# Patient Record
Sex: Male | Born: 1940 | Race: White | Hispanic: No | Marital: Married | Smoking: Former smoker
Health system: Southern US, Community
[De-identification: ages and names within clinical notes are randomized; demographics above are authoritative.]

## PROBLEM LIST (undated history)

## (undated) DIAGNOSIS — M5416 Radiculopathy, lumbar region: Secondary | ICD-10-CM

## (undated) DIAGNOSIS — N411 Chronic prostatitis: Secondary | ICD-10-CM

## (undated) DIAGNOSIS — H905 Unspecified sensorineural hearing loss: Secondary | ICD-10-CM

## (undated) DIAGNOSIS — R9439 Abnormal result of other cardiovascular function study: Secondary | ICD-10-CM

## (undated) DIAGNOSIS — N183 Chronic kidney disease, stage 3 unspecified: Secondary | ICD-10-CM

## (undated) DIAGNOSIS — N400 Enlarged prostate without lower urinary tract symptoms: Secondary | ICD-10-CM

## (undated) DIAGNOSIS — G4733 Obstructive sleep apnea (adult) (pediatric): Secondary | ICD-10-CM

## (undated) DIAGNOSIS — R0602 Shortness of breath: Secondary | ICD-10-CM

## (undated) DIAGNOSIS — R300 Dysuria: Secondary | ICD-10-CM

## (undated) DIAGNOSIS — E663 Overweight: Secondary | ICD-10-CM

## (undated) DIAGNOSIS — I83892 Varicose veins of left lower extremities with other complications: Secondary | ICD-10-CM

## (undated) DIAGNOSIS — J329 Chronic sinusitis, unspecified: Secondary | ICD-10-CM

## (undated) DIAGNOSIS — K219 Gastro-esophageal reflux disease without esophagitis: Secondary | ICD-10-CM

## (undated) DIAGNOSIS — K2100 Gastro-esophageal reflux disease with esophagitis, without bleeding: Secondary | ICD-10-CM

## (undated) DIAGNOSIS — G4730 Sleep apnea, unspecified: Secondary | ICD-10-CM

## (undated) DIAGNOSIS — J449 Chronic obstructive pulmonary disease, unspecified: Secondary | ICD-10-CM

## (undated) DIAGNOSIS — E785 Hyperlipidemia, unspecified: Secondary | ICD-10-CM

## (undated) DIAGNOSIS — J343 Hypertrophy of nasal turbinates: Secondary | ICD-10-CM

## (undated) DIAGNOSIS — G629 Polyneuropathy, unspecified: Secondary | ICD-10-CM

## (undated) DIAGNOSIS — J302 Other seasonal allergic rhinitis: Secondary | ICD-10-CM

## (undated) DIAGNOSIS — D509 Iron deficiency anemia, unspecified: Secondary | ICD-10-CM

## (undated) DIAGNOSIS — M5126 Other intervertebral disc displacement, lumbar region: Secondary | ICD-10-CM

## (undated) DIAGNOSIS — K649 Unspecified hemorrhoids: Secondary | ICD-10-CM

## (undated) DIAGNOSIS — R972 Elevated prostate specific antigen [PSA]: Secondary | ICD-10-CM

## (undated) DIAGNOSIS — R079 Chest pain, unspecified: Secondary | ICD-10-CM

## (undated) DIAGNOSIS — M545 Low back pain, unspecified: Secondary | ICD-10-CM

## (undated) DIAGNOSIS — J22 Unspecified acute lower respiratory infection: Secondary | ICD-10-CM

## (undated) DIAGNOSIS — K648 Other hemorrhoids: Secondary | ICD-10-CM

## (undated) HISTORY — DX: Hypertrophy of nasal turbinates: J34.3

## (undated) HISTORY — DX: Unspecified sensorineural hearing loss: H90.5

## (undated) HISTORY — DX: Radiculopathy, lumbar region: M54.16

## (undated) HISTORY — PX: SPINE SURGERY: SHX786

## (undated) HISTORY — DX: Elevated prostate specific antigen (PSA): R97.20

## (undated) HISTORY — DX: Polyneuropathy, unspecified: G62.9

## (undated) HISTORY — DX: Obstructive sleep apnea (adult) (pediatric): G47.33

## (undated) HISTORY — DX: Gastro-esophageal reflux disease with esophagitis: K21.0

## (undated) HISTORY — DX: Gastro-esophageal reflux disease without esophagitis: K21.9

## (undated) HISTORY — DX: Overweight: E66.3

## (undated) HISTORY — DX: Low back pain, unspecified: M54.50

## (undated) HISTORY — DX: Benign prostatic hyperplasia without lower urinary tract symptoms: N40.0

## (undated) HISTORY — DX: Other seasonal allergic rhinitis: J30.2

## (undated) HISTORY — DX: Chronic kidney disease, stage 3 unspecified: N18.30

## (undated) HISTORY — DX: Gastro-esophageal reflux disease with esophagitis, without bleeding: K21.00

## (undated) HISTORY — DX: Shortness of breath: R06.02

## (undated) HISTORY — DX: Other intervertebral disc displacement, lumbar region: M51.26

## (undated) HISTORY — DX: Other hemorrhoids: K64.8

## (undated) HISTORY — DX: Iron deficiency anemia, unspecified: D50.9

## (undated) HISTORY — DX: Unspecified acute lower respiratory infection: J22

## (undated) HISTORY — PX: NASAL SINUS SURGERY: SHX719

## (undated) HISTORY — DX: Dysuria: R30.0

## (undated) HISTORY — DX: Hyperlipidemia, unspecified: E78.5

## (undated) HISTORY — DX: Chronic sinusitis, unspecified: J32.9

## (undated) HISTORY — DX: Unspecified hemorrhoids: K64.9

## (undated) HISTORY — DX: Chronic obstructive pulmonary disease, unspecified: J44.9

## (undated) HISTORY — DX: Chronic prostatitis: N41.1

## (undated) HISTORY — DX: Low back pain: M54.5

## (undated) HISTORY — DX: Chest pain, unspecified: R07.9

## (undated) HISTORY — DX: Abnormal result of other cardiovascular function study: R94.39

## (undated) HISTORY — DX: Varicose veins of left lower extremity with other complications: I83.892

## (undated) HISTORY — DX: Sleep apnea, unspecified: G47.30

---
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
## 1999-06-20 ENCOUNTER — Ambulatory Visit (HOSPITAL_COMMUNITY): Admission: RE | Admit: 1999-06-20 | Discharge: 1999-06-20 | Payer: Self-pay | Admitting: Specialist

## 2000-12-12 ENCOUNTER — Ambulatory Visit (HOSPITAL_COMMUNITY): Admission: RE | Admit: 2000-12-12 | Discharge: 2000-12-12 | Payer: Self-pay | Admitting: Orthopaedic Surgery

## 2000-12-12 ENCOUNTER — Encounter: Payer: Self-pay | Admitting: Orthopaedic Surgery

## 2003-04-21 ENCOUNTER — Ambulatory Visit (HOSPITAL_BASED_OUTPATIENT_CLINIC_OR_DEPARTMENT_OTHER): Admission: RE | Admit: 2003-04-21 | Discharge: 2003-04-21 | Payer: Self-pay | Admitting: Internal Medicine

## 2003-04-21 ENCOUNTER — Encounter: Payer: Self-pay | Admitting: Pulmonary Disease

## 2003-08-17 ENCOUNTER — Encounter: Payer: Self-pay | Admitting: Pulmonary Disease

## 2003-09-22 ENCOUNTER — Encounter: Payer: Self-pay | Admitting: Pulmonary Disease

## 2003-11-08 ENCOUNTER — Encounter: Payer: Self-pay | Admitting: Pulmonary Disease

## 2006-09-04 ENCOUNTER — Inpatient Hospital Stay (HOSPITAL_COMMUNITY): Admission: RE | Admit: 2006-09-04 | Discharge: 2006-09-05 | Payer: Self-pay | Admitting: Orthopaedic Surgery

## 2007-09-18 ENCOUNTER — Ambulatory Visit: Payer: Self-pay | Admitting: Pulmonary Disease

## 2007-09-18 DIAGNOSIS — G4733 Obstructive sleep apnea (adult) (pediatric): Secondary | ICD-10-CM | POA: Insufficient documentation

## 2007-09-18 DIAGNOSIS — G4730 Sleep apnea, unspecified: Secondary | ICD-10-CM

## 2007-09-18 DIAGNOSIS — K219 Gastro-esophageal reflux disease without esophagitis: Secondary | ICD-10-CM | POA: Insufficient documentation

## 2007-09-18 HISTORY — DX: Sleep apnea, unspecified: G47.30

## 2007-09-18 HISTORY — DX: Obstructive sleep apnea (adult) (pediatric): G47.33

## 2007-10-20 ENCOUNTER — Telehealth (INDEPENDENT_AMBULATORY_CARE_PROVIDER_SITE_OTHER): Payer: Self-pay | Admitting: *Deleted

## 2007-12-04 ENCOUNTER — Encounter: Payer: Self-pay | Admitting: Pulmonary Disease

## 2007-12-18 ENCOUNTER — Telehealth: Payer: Self-pay | Admitting: Pulmonary Disease

## 2008-06-02 ENCOUNTER — Ambulatory Visit: Payer: Self-pay | Admitting: Pulmonary Disease

## 2010-06-13 NOTE — Op Note (Signed)
NAMELESEAN, [name] [name]                ACCOUNT NO.:  [id number]   MEDICAL RECORD NO.:  [id number]          PATIENT TYPE:  INP   LOCATION:  2550                         FACILITY:  [facility]   PHYSICIAN:  Sharolyn Douglas, M.D.        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   PROCEDURE PERFORMED:  Minimally invasive micro laminotomy, left L4-L5,  with diskectomy, foraminotomy and lateral recess decompression.   SURGEON:  Sharolyn Douglas, M.D.   ASSISTANT SURGEON:  Fannie Knee Art, N.P.A.   ANESTHESIA:  General endotracheal.   ESTIMATED BLOOD LOSS:  Minimal.   COMPLICATIONS:  None.   SURGICAL COUNTS:  Needle and sponge counts were correct.   INDICATIONS FOR PROCEDURE:  The patient is a pleasant 70 year old male  with severe left lower extremity radicular pain and foot drop.  His  imaging studies show an extruded disk herniation at L4-L5.  He now  presents for minimally invasive decompression.  The risks, benefits and  alternatives were reviewed and the patient elected to proceed.   DESCRIPTION OF PROCEDURE:  After informed consent, the patient was taken  to the operating room.  He underwent general endotracheal anesthesia  without difficulty, and was given prophylactic IV antibiotics.  He was  carefully turned prone onto the Wilson frame.  All bony prominences were  padded, basically protected at all times, the back prepped and draped in  the usual sterile fashion.  Fluoroscopy was brought into the field.   The L4-L5 interspace was identified.  A 2 cm incision was made over the  L4-L5 interspace in the midline.  Dissection was carried sharply through  the deep fascia.  Dilators from the Abbott spine minimally invasive  retractor were used to dock onto the L4-L5 interspace.  This was done  under fluoroscopic  guidance.  We then placed the 70 mm retractor blades  over the last dilator and the retractor was expanded, dilating the  paraspinal muscles.  Further fluoroscopic imaging confirmed that the  retractor was placed over the L4-L5 segment.   At this point the microscope was draped and brought into the field.  The  L4-L5 interlaminar space was further exposed.  A high-speed bur was used  to remove the inferior one-third of the L4 lamina.  The ligamentum  flavum was removed piecemeal.  The lateral border of the thecal sac was  identified and mobilized.  The lateral recess was decompressed by  undercutting the facet joint and removing the ligamentum flavum.  We  identified the transversing L5 nerve root.  There was a bulging disk in  the lateral recess.  The disk space was entered and Epstein curets were  used to decompress the chronic-appearing disk herniation back into the  interspace and this was removed with a pituitary.  We then explored the  spinal canal.  We were able to freely pass a Public house manager up behind  the  L4 vertebral body and freely rotate it.  We then evaluated the  foramen.  By using a downbiting pituitary, we removed a large disk  fragment which was in the foramen on an extraforaminal space.  At this  point we felt that we had removed the disk herniation.   The wound was irrigated, hemostasis was achieved.  A small piece of  Gelfoam was left over exposed epidural space.  The deep fascia was  closed with 0 Vicryl suture; the subcutaneous layer was closed with 2-0  Vicryl; followed by Dermabond on the skin edges.  The patient was turned  supine, extubated without difficulty, and transferred to the recovery  room in stable condition.   It should be noted that my assistant, Fannie Knee Art, N.P.A., was present  throughout the procedure.  She assisted me under the microscope by  retracting the nerve roots and providing suction.  She then assisted me  with wound closure.       Sharolyn Douglas, M.D.  Electronically Signed     MC/MEDQ  D:  09/04/2006  T:  09/05/2006  Job:  [id number]

## 2010-06-16 NOTE — Cardiovascular Report (Signed)
. [name]  Patient:    [name] [name], [name] [name]                       MRN: [id number] Proc. Date: 06/20/99 Adm. Date:  [id number] Disc. Date: [id number] Attending:  Daisey Must CC:         Barbera Setters. Lawana Pai, M.D., [place], [place]             Cardiac Catheterization Lab                        Cardiac Catheterization  PROCEDURE PERFORMED:  Left heart catheterization with coronary angiography and left ventriculography.  INDICATIONS:  Mr. Ricke is a 70 year old male with recent onset of chest discomfort.  A stress echocardiogram done in [facility] was interpreted as revealing inferior ischemia.  He was referred for cardiac catheterization.  PROCEDURAL NOTE:  A 6-French sheath was placed in the right femoral artery. Standard Judkins 6-French catheters were utilized.  Contrast was Omnipaque. There were no complications.  RESULTS:  Hemodynamics:  Left ventricular pressure 122/16; aortic pressure 124/66.  No aortic valve gradient.  Left ventriculogram:  Wall motion is normal.  Ejection fraction estimated at 55%.  Coronary arteriography (right dominant): 1. The left main is short but free of angiographic disease. 2. The left anterior descending artery has a tubular 30% stenosis in the mid    vessel.  It gives rise to a small first diagonal and normal sized second    diagonal and a small third diagonal. 3. The left circumflex has a 20% stenosis in the mid vessel.  It gives rise    to a small first marginal branch, a large second marginal, and a small    third marginal.  There is a 25% stenosis at the origin of the second    marginal. 4. The right coronary artery is a dominant, but relatively small, vessel.  It    has a 25% stenosis proximally.  It gives rise to a small posterior    descending artery and a small posterolateral branch.  IMPRESSIONS: 1. Normal left ventricular systolic function. 2. Mild nonobstructive coronary artery disease. DD:   06/20/99 TD:  06/23/99 Job: [id number]

## 2010-09-05 ENCOUNTER — Encounter: Payer: Self-pay | Admitting: Pulmonary Disease

## 2010-09-05 ENCOUNTER — Ambulatory Visit (INDEPENDENT_AMBULATORY_CARE_PROVIDER_SITE_OTHER): Payer: Medicare Other | Admitting: Pulmonary Disease

## 2010-09-05 VITALS — BP 118/78 | HR 59 | Temp 97.6°F | Ht 64.5 in | Wt 167.8 lb

## 2010-09-05 DIAGNOSIS — G4733 Obstructive sleep apnea (adult) (pediatric): Secondary | ICD-10-CM

## 2010-09-05 NOTE — Progress Notes (Signed)
  Subjective:    Patient ID: [name] [name], male    DOB: 01-Feb-1940, 70 y.o.   MRN: [id number]  HPI The pt comes in today for f/u of his known osa.  He has been wearing cpap compliantly, and denies any issues with mask or pressure.  He does feel he is sleeping fairly well with adequate daytime alertness, but not as well as in the past.  He has an aged machine, and is also noting worsening nasal congestion.     Review of Systems  Constitutional: Negative.  Negative for fever and unexpected weight change.  HENT: Positive for congestion and sinus pressure. Negative for ear pain, nosebleeds, sore throat, rhinorrhea, sneezing, trouble swallowing, dental problem and postnasal drip.   Eyes: Negative.  Negative for redness and itching.  Respiratory: Positive for shortness of breath. Negative for cough, chest tightness and wheezing.   Cardiovascular: Negative.  Negative for palpitations and leg swelling.  Gastrointestinal: Negative.  Negative for nausea and vomiting.  Genitourinary: Negative.  Negative for dysuria.  Musculoskeletal: Negative.  Negative for joint swelling.  Skin: Negative.  Negative for rash.  Neurological: Negative.  Negative for headaches.  Hematological: Negative.  Does not bruise/bleed easily.  Psychiatric/Behavioral: Negative.  Negative for dysphoric mood. The patient is not nervous/anxious.        Objective:   Physical Exam OW male in nad Large turbinates with narrowing of nasal airway, deviated septum to left with obstruction No skin breakdown or pressure necrosis from cpap mask LE with minimal edema, no cyanosis Alert and oriented, moves all 4. Does not appear sleepy.        Assessment & Plan:

## 2010-09-05 NOTE — Patient Instructions (Signed)
Will get your referred to ENT for evaluation of your nasal obstruction Will refer you to different DME for consideration of a new cpap machine followup with me in one year, but call if your sleep does not improve with a new machine.

## 2010-09-09 ENCOUNTER — Encounter: Payer: Self-pay | Admitting: Pulmonary Disease

## 2010-09-09 NOTE — Assessment & Plan Note (Signed)
The pt is wearing cpap successfully, but needs a new cpap machine.  Will arrange for this with his DME.  He has significant anatomical nasal obstruction with ongoing symptoms, and is interested in getting this fixed.  Will refer to ENT for evaluation.

## 2010-10-04 ENCOUNTER — Ambulatory Visit (HOSPITAL_COMMUNITY)
Admission: RE | Admit: 2010-10-04 | Discharge: 2010-10-04 | Disposition: A | Discharge status: Home / Self Care | Payer: Medicare Other | Source: Ambulatory Visit | Attending: Otolaryngology | Admitting: Otolaryngology

## 2010-10-04 ENCOUNTER — Encounter (HOSPITAL_COMMUNITY)
Admission: RE | Admit: 2010-10-04 | Discharge: 2010-10-04 | Disposition: A | Discharge status: Home / Self Care | Payer: Medicare Other | Source: Ambulatory Visit | Attending: Otolaryngology | Admitting: Otolaryngology

## 2010-10-04 ENCOUNTER — Other Ambulatory Visit (HOSPITAL_COMMUNITY): Payer: Self-pay | Admitting: Otolaryngology

## 2010-10-04 DIAGNOSIS — Z01810 Encounter for preprocedural cardiovascular examination: Secondary | ICD-10-CM | POA: Insufficient documentation

## 2010-10-04 DIAGNOSIS — J329 Chronic sinusitis, unspecified: Secondary | ICD-10-CM | POA: Insufficient documentation

## 2010-10-04 DIAGNOSIS — Z01811 Encounter for preprocedural respiratory examination: Secondary | ICD-10-CM | POA: Insufficient documentation

## 2010-10-04 DIAGNOSIS — Z01812 Encounter for preprocedural laboratory examination: Secondary | ICD-10-CM | POA: Insufficient documentation

## 2010-10-04 LAB — CBC
MCH: 30.3 pg (ref 26.0–34.0)
MCV: 85.8 fL (ref 78.0–100.0)
Platelets: 139 10*3/uL — ABNORMAL LOW (ref 150–400)
RBC: 4.65 MIL/uL (ref 4.22–5.81)
RDW: 14.4 % (ref 11.5–15.5)
WBC: 5.4 10*3/uL (ref 4.0–10.5)

## 2010-10-04 LAB — BASIC METABOLIC PANEL
CO2: 29 mEq/L (ref 19–32)
Calcium: 9.8 mg/dL (ref 8.4–10.5)
Chloride: 108 mEq/L (ref 96–112)
Creatinine, Ser: 1.09 mg/dL (ref 0.50–1.35)
GFR calc Af Amer: >60 mL/min (ref >60)
Sodium: 144 mEq/L (ref 135–145)

## 2010-10-04 LAB — SURGICAL PCR SCREEN
MRSA, PCR: NEGATIVE
Staphylococcus aureus: NEGATIVE

## 2010-10-12 ENCOUNTER — Other Ambulatory Visit: Payer: Self-pay | Admitting: Otolaryngology

## 2010-10-12 ENCOUNTER — Ambulatory Visit (HOSPITAL_COMMUNITY)
Admission: RE | Admit: 2010-10-12 | Discharge: 2010-10-13 | Disposition: A | Discharge status: Home / Self Care | Payer: Medicare Other | Source: Ambulatory Visit | Attending: Otolaryngology | Admitting: Otolaryngology

## 2010-10-12 DIAGNOSIS — G4733 Obstructive sleep apnea (adult) (pediatric): Secondary | ICD-10-CM | POA: Insufficient documentation

## 2010-10-12 DIAGNOSIS — J343 Hypertrophy of nasal turbinates: Secondary | ICD-10-CM | POA: Insufficient documentation

## 2010-10-12 DIAGNOSIS — J338 Other polyp of sinus: Secondary | ICD-10-CM | POA: Insufficient documentation

## 2010-10-12 DIAGNOSIS — J342 Deviated nasal septum: Secondary | ICD-10-CM | POA: Insufficient documentation

## 2010-10-12 DIAGNOSIS — J320 Chronic maxillary sinusitis: Secondary | ICD-10-CM | POA: Insufficient documentation

## 2010-10-12 DIAGNOSIS — J321 Chronic frontal sinusitis: Secondary | ICD-10-CM | POA: Insufficient documentation

## 2010-10-12 DIAGNOSIS — J322 Chronic ethmoidal sinusitis: Secondary | ICD-10-CM | POA: Insufficient documentation

## 2010-10-20 NOTE — Op Note (Signed)
[name], [name] NO.:  [id number]  MEDICAL RECORD NO.:  [id number]  LOCATION:  2602                         FACILITY:  [facility]  PHYSICIAN:  [name] [name], M.D.DATE OF BIRTH:  02/09/1940  DATE OF PROCEDURE:  10/12/2010 DATE OF DISCHARGE:                              OPERATIVE REPORT   PREOPERATIVE DIAGNOSES: 1. Chronic sinusitis. 2. Nasal polyposis. 3. Severe nasal septal deviation. 4. Inferior turbinate hypertrophy. 5. Obstructive sleep apnea.  POSTOPERATIVE DIAGNOSES: 1. Chronic sinusitis. 2. Nasal polyposis. 3. Severe nasal septal deviation. 4. Inferior turbinate hypertrophy. 5. Obstructive sleep apnea.  INDICATIONS FOR SURGERY: 1. Chronic sinusitis. 2. Nasal polyposis. 3. Severe nasal septal deviation. 4. Inferior turbinate hypertrophy. 5. Obstructive sleep apnea.  SURGICAL PROCEDURES: 1. Bilateral endoscopic sinus surgery with intraoperative computer-     assisted navigation (Stealth) consisting of bilateral total     ethmoidectomy, bilateral maxillary antrostomy with removal of     diseased tissue and bilateral nasal frontal recess exploration. 2. Nasal septoplasty. 3. Bilateral inferior turbinate reduction.  SURGEON:  [name] Scales. [name], MD  ANESTHESIA:  General endotracheal.  COMPLICATIONS:  None.  ESTIMATED BLOOD LOSS:  100 mL.  The patient transferred from the operating room to recovery room in stable condition.  FINDINGS:  Severe nasal septal deviation with nasal airway obstruction and turbinate hypertrophy.  Intranasal polyps involving the sinus cavities bilaterally.  Bilateral Doyle nasal septal splints placed at the conclusion of the surgical procedure, bilateral Kennedy sinus packs placed in the common ethmoid cavity and Kenalog Bactroban slurry placed in the sinuses.[name] [name]  BRIEF HISTORY:  The patient is a 70 year old white male referred to our office with a history of chronic and progressive nasal  airway obstruction, nighttime breathing issues associate with obstructive sleep apnea and difficulty wearing CPAP secondary to nasal obstruction, and chronic nasal discharge, and low-grade sinusitis.  The patient had been wearing CPAP for a long period of time but was having increasing difficulty using the device because of nasal airway obstruction. Examination in the office revealed a severely deviated nasal septum with left-sided nasal blockage and turbinate hypertrophy.  Based on the patient's history, a CT scan of the sinus was performed and this shows an extensive polypoid disease involving the maxillary ethmoid sinuses and the nasal frontal recess bilaterally.  The patient was treated with oral steroids, topical nasal steroid, saline nasal spray, and antibiotics with limited improvement in symptoms.  Given his history and findings, I recommend the above surgical procedures.  The risks, benefits and possible complications were discussed in detail with the patient and his wife and surgery is scheduled as an outpatient under general anesthesia at [facility] on October 12, 2010, with intended admission to the hospital and overnight observation for sleep apnea.  Prior to surgery, a Stealth formatted CT scan of the sinus was performed for intraoperative computer-assisted navigation.  SURGICAL PROCEDURE:  The patient was brought to the operating room on October 12, 2010, and placed in supine position on the operating table.  General endotracheal anesthesia was established without difficulty.  The patient adequately anesthetized.  He was positioned on the operating table and prepped and draped in a sterile fashion.  Nasal cavity was inspected and his nose was injected with a total of 12 mL of 1% lidocaine and 1:100,000 dilution of epinephrine injected in submucosal fashion on the nasal septum, inferior middle turbinates, lateral nasal wall, and intranasal polyps.  The  patient's nose was then packed with Afrin soaked cottonoid pledgets and left in place for approximately 10 minutes to allow for vasoconstriction hemostasis.  The Stealth navigation headgear was applied and anatomic and surgical landmarks were identified and confirmed.  The Stealth device was used throughout the sinus portion of the surgical procedure.  With the patient positioned and prepped and draped, surgical procedure was begun with right endoscopic sinus surgery using a 0-degree telescope.  Intranasal polyps were resected with a straight microdebrider.  Dissection was carried to the middle meatus.  The middle turbinate was gently medialized and the uncinate process reflected laterally and resected with a microdebrider.  The ethmoid bulla was identified and dissection was carried from anterior to posterior through the floor of the ethmoid sinuses, again resecting bony septations and polypoid disease.  The posterior aspect of the right ethmoid sinus was identified and using a 45-degree telescope dissection was carried out from posterior to anterior along the roof of the ethmoid sinus dissecting bony septations, the natural ostium of the maxillary sinus was identified on the lateral nasal wall and was enlarged in an anterior direction.  Polypoid tissue from within the maxillary sinus was then resected with a curved microdebrider.  Using the 45-degree telescope, the nasal frontal recess was identified.  There was underlying ethmoid mucosal thickening and disease with obstruction.  Natural ostium of the nasal frontal recess was identified and confirmed with the Stealth device.  Intervening soft tissue was resected and the nasal frontal recess was widely patent at the conclusion of the surgical procedure.  Attention then turned to the left-hand side were left endoscopic sinus surgery was undertaken.  Again, the middle turbinate was gently medialized.  Deviated septum was not  significantly in the way of this portion of the procedure and endoscopic sinus surgery was undertaken on the left side.  A total ethmoidectomy was performed after resection of the uncinate process.  Dissection was carried from anterior to posterior through the floor of the ethmoid into the posterior ethmoid air cells, then posterior to anterior along the roof of the ethmoid cells using a 45-degree telescope, curved microdebrider, and Stealth navigation tool. Nasal frontal recess was identified, again occluded with underlying ethmoid disease and mucosal thickening.  There was a type 3 accessory midline sinus which was also encountered and the ostia were created at the conclusion of the procedure.  Attention then turned to the lateral nasal wall where the maxillary sinus was inspected.  The patient had a large posterior accessory sinus.  The intervening soft tissue was resected with a Through-Cutting forceps and the natural ostium was enlarged in a posterior direction.  Within the sinuses, a small polyp was then resected with a curved microdebrider.  Sinus surgery completed, nasal septoplasty was undertaken.  A right anterior hemitransfixion incision was created, a mucoperichondrial flap was elevated from anterior to posterior, cartilaginous septum was crossed in the midline and the mucoperichondrial flap was elevated on the left, deviated bone and cartilage in the mid and posterior aspect of the nasal septum were then resected, anterior columellar cartilage left intact.  The mid septal cartilage was morselized and later returned to the mucoperichondrial pocket.  Large inferior septal spur was then mobilized with a Through-Cutting forceps and resected  creating a good midline nasal septum.  The mucosal flaps were reapproximated with a 4-0 gut suture on a Keith needle in a horizontal mattressing fashion. Bilateral Doyle nasal septal splints were placed at the application of Bactroban  ointment and sutured in position with a 3-0 Ethilon suture.  Inferior turbinate reduction was then performed with cautery set at 12 watts.  Two submucosal passes were made with the bipolar intramural cautery.  When the turbinates have been adequately cauterized, anterior incisions were created, small amount of turbinate bone was then resected after elevating the overlying soft tissue, turbinates were then outfractured.  We had a more patent nasal cavity.  The patient's nasal cavity was then carefully inspected.  Surgical debris cleared.  Sinuses and nasopharynx were then irrigated and suctioned.  There was no active bleeding.  A 50:50 mixture of Kenalog 40 and Bactroban cream were then used to make a slurry and this was instilled in the frontal, ethmoid and maxillary sinus bilaterally. Bilateral Kennedy sinus packs were then placed in the common ethmoid cavity.  The patient's oral cavity and oropharynx were irrigated and suctioned.  Orogastric tube was passed.  Stomach contents aspirated. The patient was then awakened from his anesthetic, he was extubated and transferred from the operating room to the recovery room in stable condition.  No complications.  The blood loss approximately 100 mL.          ______________________________ [name] [name], M.D.     DLS/MEDQ  D:  28/31/5176  T:  10/12/2010  Job:  [id number]  Electronically Signed by Osborn Coho M.D. on 10/20/2010 12:31:38 PM

## 2010-11-13 LAB — ABO/RH: ABO/RH(D): O POS

## 2010-11-13 LAB — APTT: aPTT: 30

## 2010-11-13 LAB — TYPE AND SCREEN

## 2011-01-19 ENCOUNTER — Encounter (HOSPITAL_BASED_OUTPATIENT_CLINIC_OR_DEPARTMENT_OTHER): Payer: Medicare Other

## 2012-02-19 ENCOUNTER — Encounter: Payer: Self-pay | Admitting: Pulmonary Disease

## 2012-02-19 ENCOUNTER — Ambulatory Visit (INDEPENDENT_AMBULATORY_CARE_PROVIDER_SITE_OTHER): Payer: Medicare Other | Admitting: Pulmonary Disease

## 2012-02-19 VITALS — BP 118/60 | HR 52 | Temp 98.1°F | Ht 64.0 in | Wt 166.8 lb

## 2012-02-19 DIAGNOSIS — G4733 Obstructive sleep apnea (adult) (pediatric): Secondary | ICD-10-CM

## 2012-02-19 NOTE — Patient Instructions (Addendum)
Will get you a new cpap machine Would recommend a mask fitting session over at the sleep center. followup with me in one year if doing well.

## 2012-02-19 NOTE — Assessment & Plan Note (Signed)
The patient overall is doing well with the treatment of his obstructive sleep apnea.  I think he needs a better fitting mask, and have recommended a referral to the sleep center for a formal fitting.  He also has aged a CPAP machine, and is due for a new device.  I will send order to his medical equipment company.  The patient is to followup with me in one year if doing well.

## 2012-02-19 NOTE — Progress Notes (Signed)
  Subjective:    Patient ID: [name] [name], male    DOB: Jun 28, 1940, 72 y.o.   MRN: [id number]  HPI Patient comes in today for followup of his known obstructive sleep apnea.  He is wearing CPAP compliantly, but is having issues with his machine.  It is over 67 years old, and is due for replacement.  He is also having issues with his mask rubbing a sore spot on the bridge of his nose.  He feels that he sleeps well with the device, and is satisfied with his daytime alertness.   Review of Systems  Constitutional: Negative for fever and unexpected weight change.  HENT: Positive for congestion ( in mornings). Negative for ear pain, nosebleeds, sore throat, rhinorrhea, sneezing, trouble swallowing, dental problem, postnasal drip and sinus pressure.   Eyes: Negative for redness and itching.  Respiratory: Negative for cough, chest tightness, shortness of breath and wheezing.   Cardiovascular: Negative for palpitations and leg swelling.  Gastrointestinal: Negative for nausea and vomiting.  Genitourinary: Negative for dysuria.  Musculoskeletal: Negative for joint swelling.  Skin: Negative for rash.  Neurological: Positive for headaches ( left temple occassionally).  Hematological: Does not bruise/bleed easily.  Psychiatric/Behavioral: Negative for dysphoric mood. The patient is not nervous/anxious.        Objective:   Physical Exam Well-developed male in no acute distress Nose without purulence or discharge noted No skin breakdown or pressure necrosis from the CPAP mask Neck without lymphadenopathy or thyromegaly Lower extremities without edema, no cyanosis Alert and oriented, does not appear to be sleepy, moves all 4 extremities.        Assessment & Plan:

## 2012-02-25 ENCOUNTER — Ambulatory Visit (HOSPITAL_BASED_OUTPATIENT_CLINIC_OR_DEPARTMENT_OTHER): Payer: Medicare Other | Attending: Pulmonary Disease

## 2012-02-25 DIAGNOSIS — G4733 Obstructive sleep apnea (adult) (pediatric): Secondary | ICD-10-CM

## 2012-03-07 ENCOUNTER — Encounter: Payer: Self-pay | Admitting: *Deleted

## 2012-03-07 ENCOUNTER — Telehealth: Payer: Self-pay | Admitting: Pulmonary Disease

## 2012-03-07 DIAGNOSIS — G4733 Obstructive sleep apnea (adult) (pediatric): Secondary | ICD-10-CM

## 2012-03-07 NOTE — Telephone Encounter (Signed)
Pt added that a rx needs to go to DME today so they will come out today to change pressure. He is afraid to try to sleep tonight. They can fax the download to dr clance if called. [facility] # [phone]. Fax # [phone]. Call pt at home then try cell [phone]. Hazel Sams

## 2012-03-07 NOTE — Telephone Encounter (Signed)
Called, spoke with Beth with [facility].  Beth states she spoke with pt who states he "felt awful last night."  Beth states she advised him to call his dr regarding this but he was also requesting for her to do a download from last night.  Per Beth, his Ahi from last was 23.8 on cpap.  She then recommended pt to call dr.  Waynetta Sandy states she will fax over this information for review.    Called, spoke with pt.  Pt states he woke up at 3 am feeling like he was "smoothering and fighting for air."  Pt states this was "a terrible spell.  I felt like I was having a heart attack and like I was going to die."  Pt states he took the card to [facility] this am and was advised to call here to get recs on if he needed to see Dr. Shelle Iron or if recs could be given to change pressure.  I have advised pt that KC is off this evening.  If he is no longer in the office, we would send msg to sleep doc of the day for further recs.  He verbalized understanding of this and is aware we will call him back today with further recs.    Dr. Maple Hudson, [facility] has already left office for the day.  Will you pls look at last night's download and the the download from 02/26/12-03/06/12 and advise?  Thank you.

## 2012-03-07 NOTE — Telephone Encounter (Signed)
Pt aware of CY recs and order has been sen t to [facility].  Will forward to [facility] so he is aware.

## 2012-03-07 NOTE — Telephone Encounter (Signed)
Recommend order [facility] change to autotitration CPAP 5-20 cwp x 5 days with download report to Dr Shelle Iron  Dx OSA

## 2012-03-10 NOTE — Telephone Encounter (Signed)
Noted.  Await the download from dme to review.

## 2012-03-11 ENCOUNTER — Telehealth: Payer: Self-pay | Admitting: Pulmonary Disease

## 2012-03-11 NOTE — Telephone Encounter (Signed)
Spoke with pt He states that he spoke with [facility] and was told that they never received the order for auto CPAP with Download Will forward to [facility] to take care of this, thanks

## 2012-03-12 NOTE — Telephone Encounter (Signed)
Order reprinted and refaxed to [facility] in [place]

## 2012-03-21 ENCOUNTER — Telehealth: Payer: Self-pay | Admitting: Pulmonary Disease

## 2012-03-21 NOTE — Telephone Encounter (Signed)
I have not received this D/L.  Will send this to [facility] to follow up on.

## 2012-03-21 NOTE — Telephone Encounter (Signed)
D/L has been received and placed in Dr Shelle Iron review folder.   Dr Shelle Iron please advise. Thanks.

## 2012-03-21 NOTE — Telephone Encounter (Signed)
Download from [facility] received and given to Ashtyn to place in Dr. Teddy Spike green folder. Rhonda J Cobb

## 2012-03-21 NOTE — Telephone Encounter (Signed)
I spoke with DME. Pt is going to do another download next week. Pt states he never heard back from his download when he was auto for 5 days. He states this was faxed over to our office. Pt aware KC will be back next week. Please advise KC and Ashtyn if this download was received thanks

## 2012-03-24 ENCOUNTER — Other Ambulatory Visit: Payer: Self-pay | Admitting: Pulmonary Disease

## 2012-03-24 DIAGNOSIS — G4733 Obstructive sleep apnea (adult) (pediatric): Secondary | ICD-10-CM

## 2012-03-24 NOTE — Telephone Encounter (Signed)
Pt has big leak on mask, and auto was only for 5 days. Will work on mask fit, and redo auto for 2 weeks with download.

## 2012-03-26 NOTE — Telephone Encounter (Signed)
[name] [name], please advise on what specifically needs to be done with this.

## 2012-03-26 NOTE — Telephone Encounter (Signed)
Order to [facility] in [place] to work on mask fit and repeat auto cpap for 2wks Tobe Sos           [facility] has already sent in order for this to be handled with DME.  Nothing further needed at this time.

## 2012-03-27 ENCOUNTER — Encounter: Payer: Self-pay | Admitting: Internal Medicine

## 2012-04-16 ENCOUNTER — Telehealth: Payer: Self-pay | Admitting: Pulmonary Disease

## 2012-04-16 NOTE — Telephone Encounter (Signed)
I spoke with pt. Advised him will call with results once [facility] reviews download. Ashtyn has results and is placing this in his green folder. Please advise KC thanks

## 2012-04-21 ENCOUNTER — Encounter: Payer: Self-pay | Admitting: Pulmonary Disease

## 2012-04-21 ENCOUNTER — Telehealth: Payer: Self-pay | Admitting: Pulmonary Disease

## 2012-04-21 DIAGNOSIS — G4733 Obstructive sleep apnea (adult) (pediatric): Secondary | ICD-10-CM

## 2012-04-21 NOTE — Telephone Encounter (Signed)
LMOM x 1 

## 2012-04-21 NOTE — Telephone Encounter (Signed)
Please let pt know that his download shows that he needs high pressure to control his sleep apnea.  I would like to try him on cpap 16, then increase to 18 2 weeks later and see how he does.  He still has some leak with mask, but is better.  i would like him to call us in 3 weeks with how things are going.  Order has been sent to [facility].

## 2012-04-21 NOTE — Telephone Encounter (Signed)
Pt aware of results and recs per KC. Patient to call and let us know progress in 3 weeks after the pressure change.

## 2012-05-26 ENCOUNTER — Telehealth: Payer: Self-pay | Admitting: Pulmonary Disease

## 2012-05-26 NOTE — Telephone Encounter (Signed)
Pt aware. Nothing further needed 

## 2012-05-26 NOTE — Telephone Encounter (Signed)
Spoke with pt He was calling her [name] [name]'s request to give update on CPAP The pressure was increased from 16 to 18 a few wks ago He states that there is much less leaking from mask, but still has some He states that there is no issue with tolerating the pressure at all He is averaging 6 hrs sleep with machine on [name] from the sleep ctr tried helping him find another mask, but with the pt's facial structure he said there was not much else to do  Please advise recs thanks!

## 2012-05-26 NOTE — Telephone Encounter (Signed)
Lets give this more time and see how he does.

## 2012-07-21 ENCOUNTER — Encounter: Payer: Self-pay | Admitting: *Deleted

## 2012-07-22 ENCOUNTER — Ambulatory Visit (INDEPENDENT_AMBULATORY_CARE_PROVIDER_SITE_OTHER)
Admission: RE | Admit: 2012-07-22 | Discharge: 2012-07-22 | Disposition: A | Discharge status: Home / Self Care | Payer: Medicare Other | Source: Ambulatory Visit | Attending: Pulmonary Disease | Admitting: Pulmonary Disease

## 2012-07-22 ENCOUNTER — Ambulatory Visit (INDEPENDENT_AMBULATORY_CARE_PROVIDER_SITE_OTHER): Payer: Medicare Other | Admitting: Pulmonary Disease

## 2012-07-22 ENCOUNTER — Encounter: Payer: Self-pay | Admitting: Pulmonary Disease

## 2012-07-22 VITALS — BP 108/62 | HR 56 | Temp 98.3°F | Ht 65.75 in | Wt 169.4 lb

## 2012-07-22 DIAGNOSIS — R0609 Other forms of dyspnea: Secondary | ICD-10-CM

## 2012-07-22 DIAGNOSIS — R0600 Dyspnea, unspecified: Secondary | ICD-10-CM

## 2012-07-22 DIAGNOSIS — G4733 Obstructive sleep apnea (adult) (pediatric): Secondary | ICD-10-CM

## 2012-07-22 DIAGNOSIS — R0989 Other specified symptoms and signs involving the circulatory and respiratory systems: Secondary | ICD-10-CM

## 2012-07-22 HISTORY — DX: Other forms of dyspnea: R06.09

## 2012-07-22 HISTORY — DX: Dyspnea, unspecified: R06.00

## 2012-07-22 NOTE — Progress Notes (Signed)
  Subjective:    Patient ID: [name] [name], male    DOB: September 20, 1940, 72 y.o.   MRN: [id number]  HPI The patient is a 72 year old male who I've been asked to see for dyspnea on exertion.  The patient been given a diagnosis of COPD, but has never had pulmonary function studies.  He has been on symbicort in the past without change, and has now been on it again for one week with no change in his symptoms.  He does have a history of asthma as a child, with resolution of his symptoms during the teenage years.  The patient also has significant reflux esophagitis with hoarseness, as well as throat clearing.  He describes an intermittent shortness of breath since 2010, but feels that it is worse over the last 4 years.  He denies shortness of breath with one flight of stairs, but will get winded if he makes 3 trips to the car to bring groceries in.  He walks 1/2 miles a day with his dog over hills, but thinks this is getting more difficult.  He has no shortness of breath at rest unless he is talking for a period of time.  Patient has no significant allergy symptoms.  His weight is neutral over the last few years, and he has not had a recent cardiac workup.   Review of Systems  Constitutional: Negative for fever and unexpected weight change.  HENT: Negative for ear pain, nosebleeds, congestion, sore throat, rhinorrhea, sneezing, trouble swallowing, dental problem, postnasal drip and sinus pressure.   Eyes: Negative for redness and itching.  Respiratory: Positive for cough and shortness of breath. Negative for chest tightness and wheezing.   Cardiovascular: Negative for palpitations and leg swelling.  Gastrointestinal: Positive for abdominal pain. Negative for nausea and vomiting.       Acid heartburn//indigestion  Genitourinary: Negative for dysuria.  Musculoskeletal: Negative for joint swelling.  Skin: Negative for rash.  Neurological: Negative for headaches.  Hematological: Does not bruise/bleed easily.   Psychiatric/Behavioral: Negative for dysphoric mood. The patient is not nervous/anxious.        Objective:   Physical Exam Constitutional:  Well developed, no acute distress  HENT:  Nares patent without discharge  Oropharynx without exudate, palate and uvula are elongated  Eyes:  Perrla, eomi, no scleral icterus  Neck:  No JVD, no TMG  Cardiovascular:  Normal rate, regular rhythm, no rubs or gallops.  No murmurs        Intact distal pulses  Pulmonary :  Normal breath sounds, no stridor or respiratory distress   No rales, rhonchi, or wheezing  Abdominal:  Soft, nondistended, bowel sounds present.  No tenderness noted.   Musculoskeletal:  No lower extremity edema noted.  Lymph Nodes:  No cervical lymphadenopathy noted  Skin:  No cyanosis noted  Neurologic:  Alert, appropriate, moves all 4 extremities without obvious deficit.         Assessment & Plan:

## 2012-07-22 NOTE — Assessment & Plan Note (Signed)
The patient has dyspnea on exertion that is intermittent, and he can have totally normal exertional tolerance in between.  He has been given the diagnosis of COPD, however has never had pulmonary function studies.  He does have a history of asthma as a child, but grew out of this in his teenage years.  At this point, I would like to discontinue his current bronchodilator regimen, and get pulmonary function studies in 2 weeks.  We can then put the issue of COPD to rest.  The patient is also having a lot of issues with reflux esophagitis, with clear-cut upper airway symptoms.  Perhaps this is the cause of his DOE that is intermittent.

## 2012-07-22 NOTE — Addendum Note (Signed)
Addended by: Caryl Ada on: 07/22/2012 04:07 PM   Modules accepted: Orders

## 2012-07-22 NOTE — Patient Instructions (Addendum)
Will check chest xray today, and call you with results. Stop symbicort Will schedule for breathing studies in 2 weeks, and will call you with results when available.

## 2012-07-24 ENCOUNTER — Telehealth: Payer: Self-pay | Admitting: Pulmonary Disease

## 2012-07-24 NOTE — Telephone Encounter (Signed)
I called # listed and spoke with Lafonda Mosses. She advised me that she does not see any notes to where anyone has tried to contact us. She called one other persone and put me on hold and they did not see anything either. Will sign off message

## 2012-08-04 ENCOUNTER — Telehealth: Payer: Self-pay | Admitting: Pulmonary Disease

## 2012-08-04 NOTE — Telephone Encounter (Signed)
Error.[name] [name] ° °

## 2012-08-08 ENCOUNTER — Ambulatory Visit (HOSPITAL_COMMUNITY)
Admission: RE | Admit: 2012-08-08 | Discharge: 2012-08-08 | Disposition: A | Discharge status: Home / Self Care | Payer: Medicare Other | Source: Ambulatory Visit | Attending: Pulmonary Disease | Admitting: Pulmonary Disease

## 2012-08-08 ENCOUNTER — Telehealth: Payer: Self-pay | Admitting: *Deleted

## 2012-08-08 DIAGNOSIS — R0609 Other forms of dyspnea: Secondary | ICD-10-CM | POA: Insufficient documentation

## 2012-08-08 DIAGNOSIS — R0600 Dyspnea, unspecified: Secondary | ICD-10-CM

## 2012-08-08 DIAGNOSIS — F17200 Nicotine dependence, unspecified, uncomplicated: Secondary | ICD-10-CM | POA: Insufficient documentation

## 2012-08-08 DIAGNOSIS — G4733 Obstructive sleep apnea (adult) (pediatric): Secondary | ICD-10-CM

## 2012-08-08 DIAGNOSIS — R0989 Other specified symptoms and signs involving the circulatory and respiratory systems: Secondary | ICD-10-CM | POA: Insufficient documentation

## 2012-08-08 LAB — PULMONARY FUNCTION TEST

## 2012-08-08 MED ORDER — ALBUTEROL SULFATE (5 MG/ML) 0.5% IN NEBU
2.5000 mg | INHALATION_SOLUTION | Freq: Once | RESPIRATORY_TRACT | Status: AC
  Administered 2012-08-08: 2.5 mg via RESPIRATORY_TRACT

## 2012-08-08 NOTE — Telephone Encounter (Signed)
Pt is aware of download results. Order will be placed for pressure change.

## 2012-08-08 NOTE — Telephone Encounter (Signed)
Please let pt know that his download shows his leak is better, but he is having breakthru apnea despite cpap.  Means his pressure is not enough The download does NOT show pressure data, so unsure what settting his machine is on currently.  We had sent order to dme to increase to 16, then 18.  Is he on 18?>>please find out from dme.    Let pt know if he is already on 18, he may not be able to tolerate any higher pressure.  The best way to get his pressure needs down is to lose weight.

## 2012-08-08 NOTE — Telephone Encounter (Signed)
Patient dropped off SD card to be downloaded.  Pt states that he was told at last OV 07/22/12 to bring card in to recheck pressure setting. Download is in Tamlyn Sides folder. Patient has PFT today as well and would like those results today if available for review.   Please advise Dr Shelle Iron. Thanks.

## 2012-08-13 ENCOUNTER — Telehealth: Payer: Self-pay | Admitting: *Deleted

## 2012-08-13 NOTE — Telephone Encounter (Signed)
PFT results have been received and placed in [name] [name] folder for review. Please advise, thanks.   

## 2012-08-14 ENCOUNTER — Telehealth: Payer: Self-pay | Admitting: Pulmonary Disease

## 2012-08-14 NOTE — Telephone Encounter (Signed)
Patient scheduled 08/15/12 at 10:15 with [facility] to review PFT

## 2012-08-14 NOTE — Telephone Encounter (Signed)
Pt needs ov to review his PFT's

## 2012-08-15 ENCOUNTER — Encounter: Payer: Self-pay | Admitting: Pulmonary Disease

## 2012-08-15 ENCOUNTER — Ambulatory Visit (INDEPENDENT_AMBULATORY_CARE_PROVIDER_SITE_OTHER): Payer: Medicare Other | Admitting: Pulmonary Disease

## 2012-08-15 VITALS — BP 132/70 | HR 55 | Temp 98.0°F | Ht 65.0 in | Wt 169.0 lb

## 2012-08-15 DIAGNOSIS — R0600 Dyspnea, unspecified: Secondary | ICD-10-CM

## 2012-08-15 DIAGNOSIS — R0609 Other forms of dyspnea: Secondary | ICD-10-CM

## 2012-08-15 NOTE — Assessment & Plan Note (Signed)
The patient has totally normal PFTs, and therefore does not have the diagnosis of COPD.  However, I cannot totally exclude the possibility of occult asthma.  Given his unremarkable PFTs, I would highly recommend a cardiac workup through his primary care physician to make sure this is not the cause of his dyspnea on exertion.  This should include an echocardiogram and a noninvasive stress test.  If he has an unremarkable cardiac workup, and continues to have symptoms, I would consider a methacholine challenge test to put the issue of asthma to rest.

## 2012-08-15 NOTE — Progress Notes (Signed)
  Subjective:    Patient ID: [name] [name], male    DOB: [birth year], 72 y.o.   MRN: [id number]  HPI The patient comes in today for followup of his recent PFTs.  He had totally normal spirometry, lung volumes, as well as his diffusion capacity.  Therefore, the patient does not have COPD, but I explained to him that we cannot exclude the possibility of occult asthma.  I have reviewed this study with him in detail, and answered all of his questions.   Review of Systems  Constitutional: Negative for fever and unexpected weight change.  HENT: Negative for ear pain, nosebleeds, congestion, sore throat, rhinorrhea, sneezing, trouble swallowing, dental problem, postnasal drip and sinus pressure.   Eyes: Negative for redness and itching.  Respiratory: Negative for cough, chest tightness, shortness of breath and wheezing.   Cardiovascular: Negative for palpitations and leg swelling.  Gastrointestinal: Negative for nausea and vomiting.  Genitourinary: Negative for dysuria.  Musculoskeletal: Negative for joint swelling.  Skin: Negative for rash.  Neurological: Negative for headaches.  Hematological: Does not bruise/bleed easily.  Psychiatric/Behavioral: Negative for dysphoric mood. The patient is not nervous/anxious.        Objective:   Physical Exam Well-developed male in no acute distress Nose without purulence or discharge noted Neck without lymphadenopathy or thyromegaly Lower extremities without edema, no cyanosis Alert and oriented, moves all 4 extremities.        Assessment & Plan:

## 2012-08-15 NOTE — Patient Instructions (Addendum)
There is no evidence for COPD by your breathing tests.  I cannot 100% exclude occult asthma, but feel it is unlikely. Keep working on conditioning as much as possible. Would recommend a cardiac workup thru your primary md to make sure this is not the reason for your shortness of breath.  If you have an unremarkable cardiac workup, and your symptoms persist, we could consider a methacholine challenge test to put the issue of asthma to rest.

## 2012-12-05 IMAGING — CR DG CHEST 2V
2 series · 2 of 2 positions shown · non-contrast
Comparison: None

CLINICAL DATA: Preoperative respiratory exam for sinus surgery.
History of COPD.

CHEST - 2 VIEW

[view not recorded (1 of 2)]
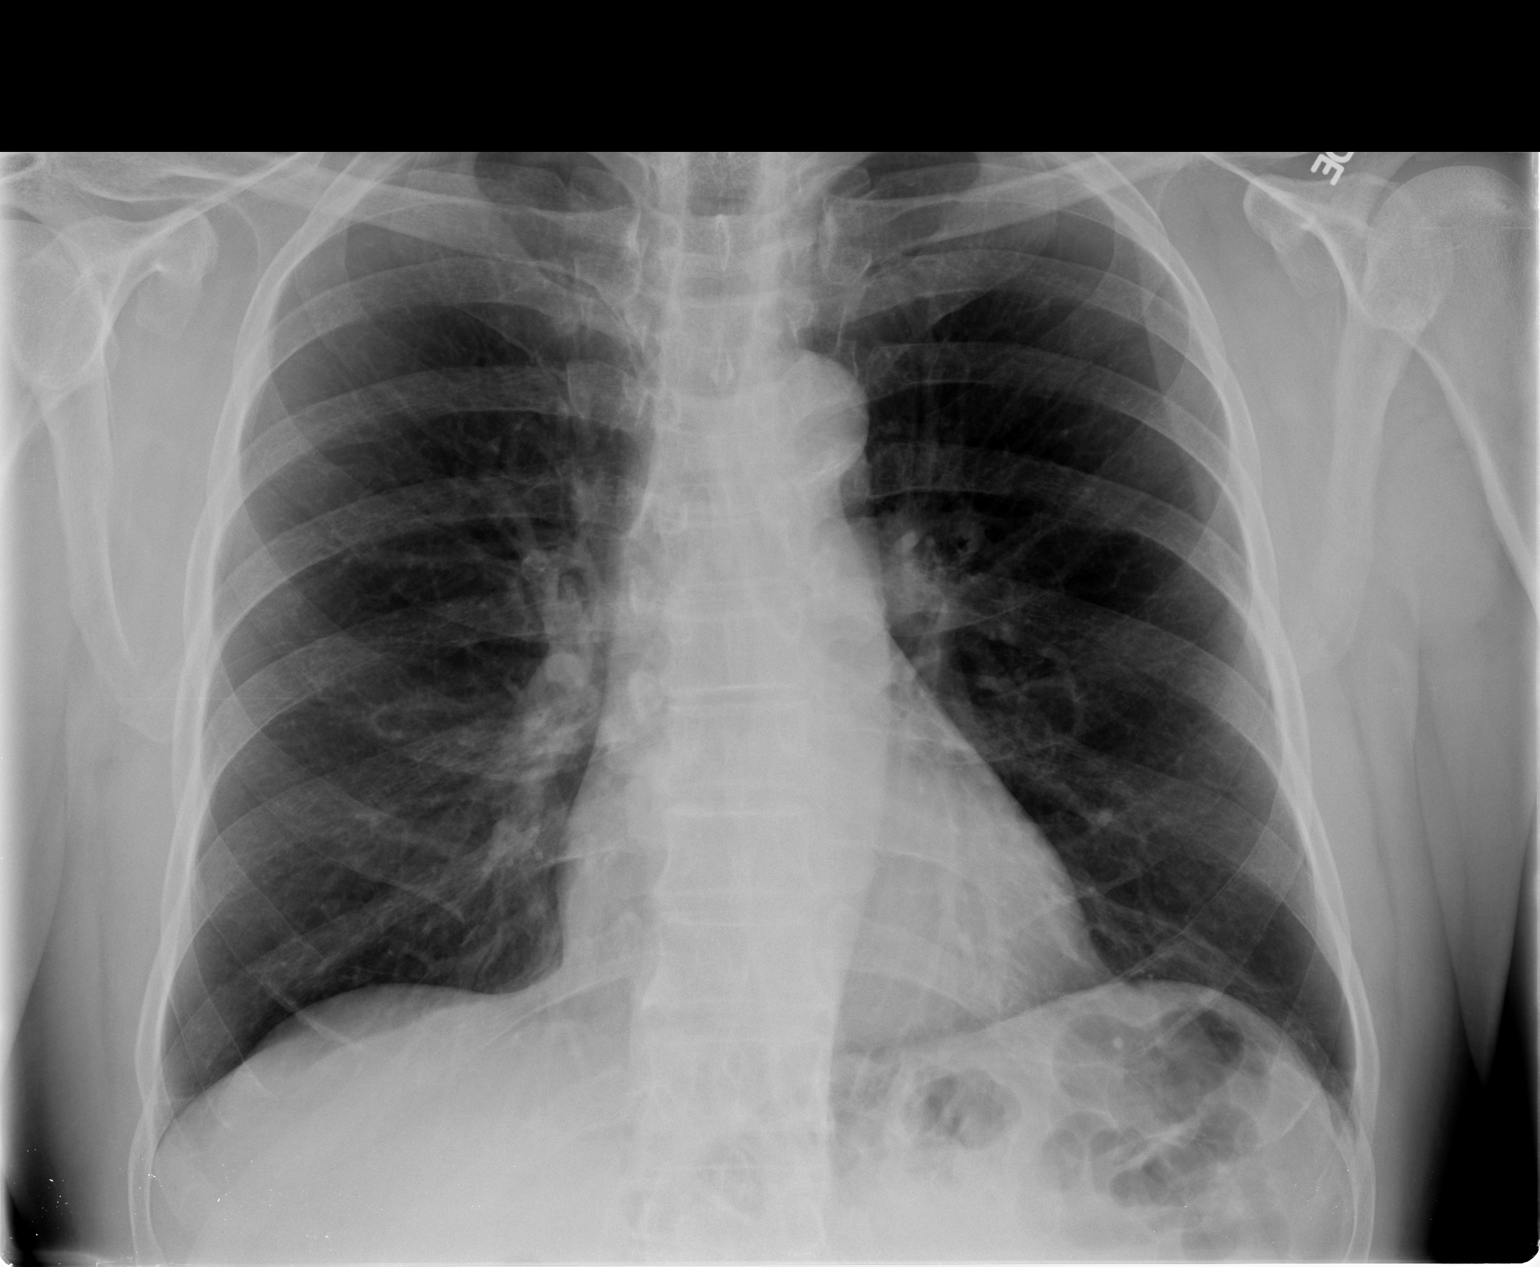

[view not recorded (2 of 2)]
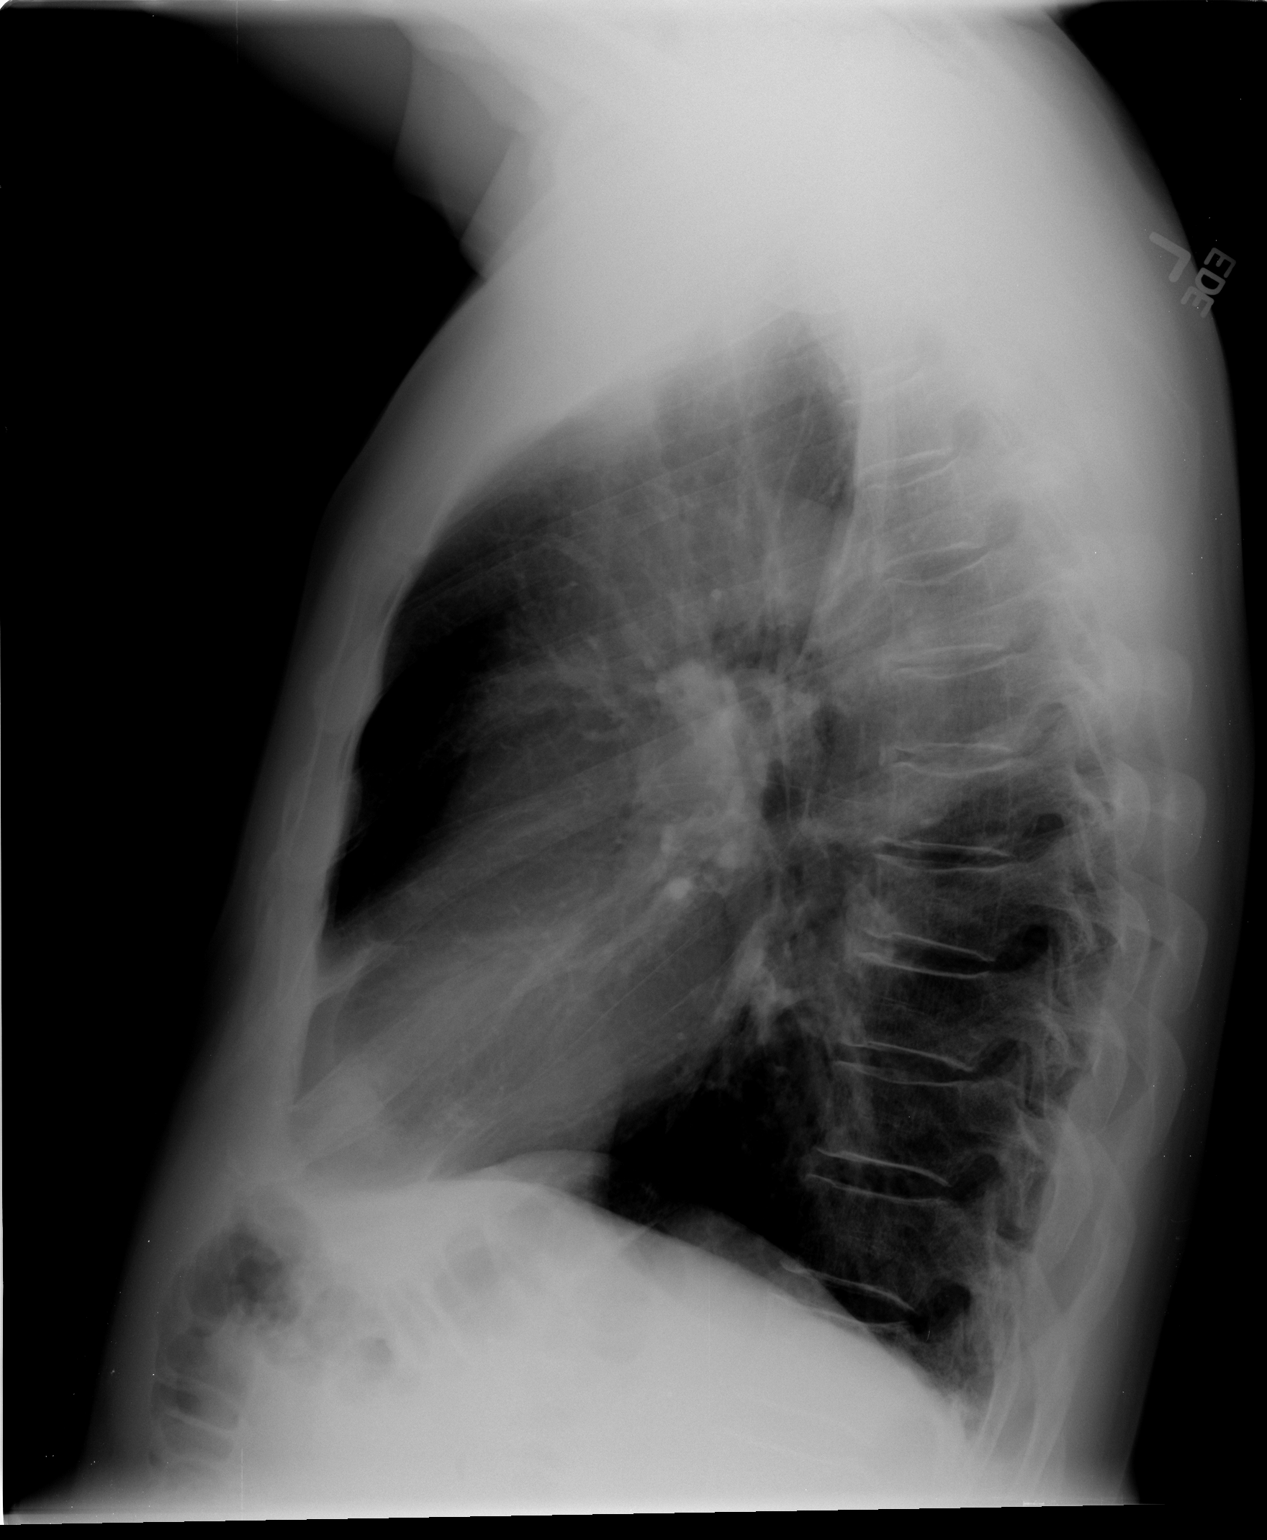

[2 of 2 positions shown; findings below may reference images not displayed]

FINDINGS: Heart size is normal.  The aorta shows mild
calcification.  Lung volumes appear grossly normal and the lungs
are clear.  No infiltrate, mass, effusion or collapse.  Ordinary
degenerative changes effect the spine.
IMPRESSION: No active disease.  No plain radiographic evidence of lung disease.

## 2013-01-05 ENCOUNTER — Telehealth: Payer: Self-pay | Admitting: Pulmonary Disease

## 2013-01-05 NOTE — Telephone Encounter (Signed)
I called [facility]. Spoke with Thurston Hole. They had faxed CMN on pt in October and then again in November. Still havent received anything. Nothing scanned in pt chart. Please advise Alida if you have this? thanks

## 2013-01-06 NOTE — Telephone Encounter (Signed)
Spoke with Dewayne Hatch @ [facility] informed I do have a cmn that is signed by Dr Shelle Iron and will fax .Kandice Hams

## 2013-02-09 ENCOUNTER — Encounter: Payer: Self-pay | Admitting: Gastroenterology

## 2013-02-10 HISTORY — PX: COLONOSCOPY: SHX174

## 2013-04-06 ENCOUNTER — Encounter: Payer: Self-pay | Admitting: Pulmonary Disease

## 2013-04-06 ENCOUNTER — Ambulatory Visit (INDEPENDENT_AMBULATORY_CARE_PROVIDER_SITE_OTHER): Payer: Medicare Other | Admitting: Pulmonary Disease

## 2013-04-06 VITALS — BP 102/70 | HR 48 | Temp 98.0°F | Ht 64.5 in | Wt 170.4 lb

## 2013-04-06 DIAGNOSIS — G4733 Obstructive sleep apnea (adult) (pediatric): Secondary | ICD-10-CM

## 2013-04-06 MED ORDER — TRAZODONE HCL 50 MG PO TABS: 50.0000 mg | ORAL_TABLET | Freq: Every day | ORAL | Status: DC

## 2013-04-06 NOTE — Patient Instructions (Signed)
Will try your cpap on 20. Please call if poor tolerance, but call in a few weeks anyway with update on how things are going. Will send in refill on your trazodone to use at bedtime only when having a hard time with sleep. followup with me again in one year.

## 2013-04-06 NOTE — Assessment & Plan Note (Signed)
Patient feels that he is tolerating his high level of CPAP fairly well, and his download shows no significant leak and excellent compliance. However, he is having breakthrough events that are significant, and is still having frequent awakenings at night. His optimal pressure is actually 20 cm of water, and he is willing to give this a try to see if that will help his sleep.

## 2013-04-06 NOTE — Addendum Note (Signed)
Addended by: Maisie FusGREEN, Grayling Schranz M on: 04/06/2013 04:39 PM   Modules accepted: Orders

## 2013-04-06 NOTE — Progress Notes (Signed)
   Subjective:    Patient ID: [name] [name], male    DOB: 10-Jul-1940, 73 y.o.   MRN: [id number]  HPI The patient comes in today for followup of his obstructive sleep apnea. He is wearing CPAP compliantly by his download, and has no significant mask leaks. Despite this, he is still having breakthrough events at 12 per hour. The patient overall sleeps fairly well, but he is still having frequent awakenings at night. I suspect that it is related to his breakthrough apneas.   Review of Systems  Constitutional: Negative for fever and unexpected weight change.  HENT: Negative for congestion, dental problem, ear pain, nosebleeds, postnasal drip, rhinorrhea, sinus pressure, sneezing, sore throat and trouble swallowing.   Eyes: Negative for redness and itching.  Respiratory: Negative for cough, chest tightness, shortness of breath and wheezing.   Cardiovascular: Negative for palpitations and leg swelling.  Gastrointestinal: Negative for nausea and vomiting.       Acid heartburn  Genitourinary: Negative for dysuria.  Musculoskeletal: Negative for joint swelling.  Skin: Negative for rash.  Neurological: Negative for headaches.  Hematological: Does not bruise/bleed easily.  Psychiatric/Behavioral: Negative for dysphoric mood. The patient is not nervous/anxious.        Objective:   Physical Exam Well-developed male in no acute distress Nose without purulence or discharge noted No skin breakdown or pressure necrosis from the CPAP mask Neck without lymphadenopathy or thyromegaly Lower extremities without edema, no cyanosis Alert and oriented, moves all 4 extremities.       Assessment & Plan:

## 2013-05-12 ENCOUNTER — Telehealth: Payer: Self-pay | Admitting: Pulmonary Disease

## 2013-05-12 NOTE — Telephone Encounter (Signed)
I would give this a little more time on 18cm.  His sleep apnea is very severe, and will have breakthru events with lower pressure.  However, I would rather he wear everynight at lower pressure than not wear at all. Let me know if he continues to have issues with 18.  I would also like him to take the device to his DME to make sure he has not hit the wrong button or didn't set something right!!

## 2013-05-12 NOTE — Telephone Encounter (Signed)
Pt is aware of KC's recs. Nothing further was needed. 

## 2013-05-12 NOTE — Telephone Encounter (Signed)
Per OV 04/06/13; Patient Instructions      Will try your cpap on 20. Please call if poor tolerance, but call in a few weeks anyway with update on how things are going. Will send in refill on your trazodone to use at bedtime only when having a hard time with sleep. followup with me again in one year.  ---  Spoke w/ pt. He reports he changed his own pressure to 20 cm. The pressure was causing to have ear problems every morning when he woke up and still having leakage. He wasn't;t sleeping at all.  He decided to change his won pressure back down to 18 cm. Even on the 18 cm he is still not sleeping and thinks about 12 episodes an hour. Please advise KC thanks

## 2014-02-26 ENCOUNTER — Telehealth: Payer: Self-pay | Admitting: Pulmonary Disease

## 2014-02-26 DIAGNOSIS — G4733 Obstructive sleep apnea (adult) (pediatric): Secondary | ICD-10-CM

## 2014-02-26 NOTE — Telephone Encounter (Signed)
Spoke with pt, states [facility].  Order placed.  Sleep study scanned into Media tab in Epic. Nothing further needed at this time.

## 2014-04-07 ENCOUNTER — Encounter: Payer: Self-pay | Admitting: Pulmonary Disease

## 2014-04-07 ENCOUNTER — Ambulatory Visit (INDEPENDENT_AMBULATORY_CARE_PROVIDER_SITE_OTHER): Payer: PPO | Admitting: Pulmonary Disease

## 2014-04-07 VITALS — BP 120/60 | HR 55 | Temp 97.0°F | Ht 64.5 in | Wt 168.0 lb

## 2014-04-07 DIAGNOSIS — G4733 Obstructive sleep apnea (adult) (pediatric): Secondary | ICD-10-CM

## 2014-04-07 MED ORDER — FLUTICASONE PROPIONATE 50 MCG/ACT NA SUSP: 2.0000 | Freq: Every day | NASAL | Status: DC

## 2014-04-07 NOTE — Progress Notes (Signed)
   Subjective:    Patient ID: [name] [name], male    DOB: January 16, 1941, 74 y.o.   MRN: [id number]  HPI The patient comes in today for follow-up of his obstructive sleep apnea. He has severe OSA which requires very high pressures, but unfortunately he is a difficult mask fit which leads to increased leaking with high pressures. We have found a sweet spot that controls the majority of his sleep apnea without inducing too much mask leak. Although it has significantly improved his sleep, he still continues to have some awakenings and daytime sleepiness at times. His download shows good compliance, but still having breakthrough with definite mask leak.   Review of Systems  Constitutional: Negative for fever and unexpected weight change.  HENT: Positive for congestion and voice change. Negative for dental problem, ear pain, nosebleeds, postnasal drip, rhinorrhea, sinus pressure, sneezing, sore throat and trouble swallowing.   Eyes: Negative for redness and itching.  Respiratory: Negative for cough, chest tightness, shortness of breath and wheezing.   Cardiovascular: Negative for palpitations and leg swelling.  Gastrointestinal: Negative for nausea and vomiting.  Genitourinary: Negative for dysuria.  Musculoskeletal: Negative for joint swelling.  Skin: Negative for rash.  Neurological: Negative for headaches.  Hematological: Does not bruise/bleed easily.  Psychiatric/Behavioral: Negative for dysphoric mood. The patient is not nervous/anxious.        Objective:   Physical Exam Well-developed male in no acute distress Nose without purulence or discharge noted No skin breakdown or pressure necrosis from the C Pap mask Neck without lymphadenopathy or thyromegaly Lower extremities without edema, no cyanosis Alert and oriented, does not appear to be sleepy, moves all 4 extremities.       Assessment & Plan:

## 2014-04-07 NOTE — Assessment & Plan Note (Addendum)
The patient has severe obstructive sleep apnea with very high pressure needs, but unfortunately he also has significant mask fit issues. His current download shows fairly good control of his AHI at 80 events per hour, but he is still having some excessive mask leak at times. His sleep apnea is being treated fairly well, but he still has awakenings at night and nonrestorative sleep at times. At this point, I would like to work on a better fitting mask, and this may result his current sleeping issues. I will get him back to his home care company to look at some of the newer masks.  I discussed with him briefly possibly trying a bilevel device, but I really don't think this offers him any advantage given his current issues.

## 2014-04-07 NOTE — Patient Instructions (Signed)
Stay on cpap at your current level.  I think this is the sweet spot between adequately controlling your sleep apnea and not inducing more mask leak Will send an order to your home care company to let you try on the respironics amara view. followup with me again in one year if doing well, but call if having issues.

## 2014-11-29 ENCOUNTER — Encounter: Payer: Self-pay | Admitting: *Deleted

## 2014-11-29 NOTE — Progress Notes (Signed)
Patient ID: [name] [name], male   DOB: 06/25/1940, 74 y.o.   MRN: [id number]     Cardiology Office Note   Date:  12/01/2014   ID:  [name] [name], DOB [birth year], MRN [id number]  PCP:  Robb Matar, MD  Cardiologist:   Charlton Haws, MD   No chief complaint on file.     History of Present Illness: [name] [name] is a 74 y.o. male who presents for evaluation of chest pain  Most of her epic record involves f/u with Dr Shelle Iron for severe OSA that requires very high pressure And hard to fit mask.  Complained of chest pain to primary on last visit.  10/29/14.  Pain worse with exertion Onset over past month  Pain is in left precordium  CRF;s elevated lipids with LDL 135  Does have history of GERD and Hiatal hernia but this feels different  ECG in office normal as were labs including TSH.  Note indicates nuclear stress test to be ordered   Had extensive GI w/u including EGD, and GB studies with a pausity of findings.  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 OSA (obstructive sleep apnea)     severe  . Esophageal reflux   . Central hearing loss   . Allergic rhinitis   . Hyperlipidemia     Past Surgical History  Procedure Laterality Date  . Appendectomy    . Spine surgery    . Nasal sinus surgery       Current Outpatient Prescriptions  Medication Sig Dispense Refill  . ALOE VERA PO Take 2 tablets by mouth 2 (two) times daily.     . Biotin 1000 MCG tablet Take 1,000 mcg by mouth daily.    . fluticasone (FLONASE) 50 MCG/ACT nasal spray Place 2 sprays into both nostrils daily. (Patient taking differently: Place 2 sprays into both nostrils daily as needed. ) 16 g 2  . traZODone (DESYREL) 50 MG tablet Take 1 tablet (50 mg total) by mouth at bedtime. Take 1-2 at bedtime if needed. 30 tablet 0  .  nitroGLYCERIN (NITROSTAT) 0.4 MG SL tablet Place 1 tablet (0.4 mg total) under the tongue every 5 (five) minutes as needed. 25 tablet 3   No current facility-administered medications for this visit.    Allergies:   Penicillins    Social History:  The patient  reports that he quit smoking about 50 years ago. His smoking use included Cigarettes. He has a 11 pack-year smoking history. He has never used smokeless tobacco. He reports that he does not drink alcohol or use illicit drugs.   Family History:  The patient's family history includes Allergies in his brother, father, mother, and sister; Asthma in his brother; Cancer in his brother and sister; Heart disease in his brother, mother, and sister.    ROS:  Please see the history of present illness.   Otherwise, review of systems are positive for none.   All other systems are reviewed and negative.    PHYSICAL EXAM: VS:  BP 130/62 mmHg  Pulse 54  Ht  (1.626 m)  Wt 75.66 kg (166 lb 12.8 oz)  BMI 28.62 kg/m2 , BMI Body mass index is 28.62 kg/(m^2). Affect appropriate Healthy:  appears stated age HEENT: normal Neck supple with no adenopathy  JVP normal no bruits no thyromegaly Lungs clear with no wheezing and good diaphragmatic motion Heart:  S1/S2 no murmur, no rub, gallop or click PMI normal Abdomen: benighn, BS positve, no tenderness, no AAA post appy no bruit.  No HSM or HJR Distal pulses intact with no bruits No edema Neuro non-focal Skin warm and dry Left foot flexor weakness Previous lumbar spine surgery     EKG:  10/04/10  SR rate 50  Normal    Recent Labs: No results found for requested labs within last 365 days.    Lipid Panel No results found for: CHOL, TRIG, HDL, CHOLHDL, VLDL, LDLCALC, LDLDIRECT    Wt Readings from Last 3 Encounters:  12/01/14 75.66 kg (166 lb 12.8 oz)  04/07/14 76.204 kg (168 lb)  04/06/13 77.293 kg (170 lb 6.4 oz)      Other studies Reviewed: Additional studies/ records that were  reviewed today include: Records from [facility] in [place] .    ASSESSMENT AND PLAN:  1.  Chest Pain:  Persistent Normal cath 2003  Negative GI w/u. Normal ECG  F/u ETT  Nitro called in to see if it helps 2. GERD:  F/u GI continue aloa vera  Discussed low spice low carb diet 3. OSA:  Dr Shelle Ironlance retiring if he has a hard time getting in with pulmonary can schedule f/u with Dr Cathie Beamsurner/Kelly I wonder if high CPAP settings related to chest pains  No pneumomediastinum on exam    Current medicines are reviewed at length with the patient today.  The patient does not have concerns regarding medicines.  The following changes have been made:  SL nitro called in   Labs/ tests ordered today include: ETT  Orders Placed This Encounter  Procedures  . Exercise Tolerance Test  . EKG 12-Lead     Disposition:   FU with me next available      Signed, Charlton HawsPeter Lake Cinquemani, MD  12/01/2014 4:16 PM    [facility] [address], [place], [place]  [address] Phone: [phone]; Fax: [phone]

## 2014-12-01 ENCOUNTER — Encounter: Payer: Self-pay | Admitting: Cardiovascular Disease

## 2014-12-01 ENCOUNTER — Ambulatory Visit (INDEPENDENT_AMBULATORY_CARE_PROVIDER_SITE_OTHER): Payer: PPO | Admitting: Cardiovascular Disease

## 2014-12-01 VITALS — BP 130/62 | HR 54 | Ht 64.0 in | Wt 166.8 lb

## 2014-12-01 DIAGNOSIS — R079 Chest pain, unspecified: Secondary | ICD-10-CM | POA: Diagnosis not present

## 2014-12-01 MED ORDER — NITROGLYCERIN 0.4 MG SL SUBL: 0.4000 mg | SUBLINGUAL_TABLET | SUBLINGUAL | Status: DC | PRN

## 2014-12-01 NOTE — Patient Instructions (Addendum)
Medication Instructions:  Your physician recommends that you continue on your current medications as directed. Please refer to the Current Medication list given to you today.   Labwork: NONE  Testing/Procedures: Your physician has requested that you have an exercise tolerance test. For further information please visit [email or web address]. Please also follow instruction sheet, as given.   Follow-Up: Your physician recommends that you schedule a follow-up appointment in: NEXT AVAILABLE WITH  DR Eden EmmsNISHAN Nitroglycerin sublingual tablets What is this medicine? NITROGLYCERIN (nye troe GLI ser in) is a type of vasodilator. It relaxes blood vessels, increasing the blood and oxygen supply to your heart. This medicine is used to relieve chest pain caused by angina. It is also used to prevent chest pain before activities like climbing stairs, going outdoors in cold weather, or sexual activity. This medicine may be used for other purposes; ask your health care provider or pharmacist if you have questions. What should I tell my health care provider before I take this medicine? They need to know if you have any of these conditions: -anemia -head injury, recent stroke, or bleeding in the brain -liver disease -previous heart attack -an unusual or allergic reaction to nitroglycerin, other medicines, foods, dyes, or preservatives -pregnant or trying to get pregnant -breast-feeding How should I use this medicine? Take this medicine by mouth as needed. At the first sign of an angina attack (chest pain or tightness) place one tablet under your tongue. You can also take this medicine 5 to 10 minutes before an event likely to produce chest pain. Follow the directions on the prescription label. Let the tablet dissolve under the tongue. Do not swallow whole. Replace the dose if you accidentally swallow it. It will help if your mouth is not dry. Saliva around the tablet will help it to dissolve more quickly. Do not  eat or drink, smoke or chew tobacco while a tablet is dissolving. If you are not better within 5 minutes after taking ONE dose of nitroglycerin, call 9-1-1 immediately to seek emergency medical care. Do not take more than 3 nitroglycerin tablets over 15 minutes. If you take this medicine often to relieve symptoms of angina, your doctor or health care professional may provide you with different instructions to manage your symptoms. If symptoms do not go away after following these instructions, it is important to call 9-1-1 immediately. Do not take more than 3 nitroglycerin tablets over 15 minutes. Talk to your pediatrician regarding the use of this medicine in children. Special care may be needed. Overdosage: If you think you have taken too much of this medicine contact a poison control center or emergency room at once. NOTE: This medicine is only for you. Do not share this medicine with others. What if I miss a dose? This does not apply. This medicine is only used as needed. What may interact with this medicine? Do not take this medicine with any of the following medications: -certain migraine medicines like ergotamine and dihydroergotamine (DHE) -medicines used to treat erectile dysfunction like sildenafil, tadalafil, and vardenafil -riociguat This medicine may also interact with the following medications: -alteplase -aspirin -heparin -medicines for high blood pressure -medicines for mental depression -other medicines used to treat angina -phenothiazines like chlorpromazine, mesoridazine, prochlorperazine, thioridazine This list may not describe all possible interactions. Give your health care provider a list of all the medicines, herbs, non-prescription drugs, or dietary supplements you use. Also tell them if you smoke, drink alcohol, or use illegal drugs. Some items may interact with your medicine.  What should I watch for while using this medicine? Tell your doctor or health care  professional if you feel your medicine is no longer working. Keep this medicine with you at all times. Sit or lie down when you take your medicine to prevent falling if you feel dizzy or faint after using it. Try to remain calm. This will help you to feel better faster. If you feel dizzy, take several deep breaths and lie down with your feet propped up, or bend forward with your head resting between your knees. You may get drowsy or dizzy. Do not drive, use machinery, or do anything that needs mental alertness until you know how this drug affects you. Do not stand or sit up quickly, especially if you are an older patient. This reduces the risk of dizzy or fainting spells. Alcohol can make you more drowsy and dizzy. Avoid alcoholic drinks. Do not treat yourself for coughs, colds, or pain while you are taking this medicine without asking your doctor or health care professional for advice. Some ingredients may increase your blood pressure. What side effects may I notice from receiving this medicine? Side effects that you should report to your doctor or health care professional as soon as possible: -blurred vision -dry mouth -skin rash -sweating -the feeling of extreme pressure in the head -unusually weak or tired Side effects that usually do not require medical attention (report to your doctor or health care professional if they continue or are bothersome): -flushing of the face or neck -headache -irregular heartbeat, palpitations -nausea, vomiting This list may not describe all possible side effects. Call your doctor for medical advice about side effects. You may report side effects to FDA at 1-800-FDA-1088. Where should I keep my medicine? Keep out of the reach of children. Store at room temperature between 20 and 25 degrees C (68 and 77 degrees F). Store in Retail buyer. Protect from light and moisture. Keep tightly closed. Throw away any unused medicine after the expiration date. NOTE:  This sheet is a summary. It may not cover all possible information. If you have questions about this medicine, talk to your doctor, pharmacist, or health care provider.    2016, Elsevier/Gold Standard. (2012-11-13 17:57:36)    Any Other Special Instructions Will Be Listed Below (If Applicable).     If you need a refill on your cardiac medications before your next appointment, please call your pharmacy.

## 2014-12-10 ENCOUNTER — Encounter: Payer: PPO | Admitting: Cardiology

## 2014-12-10 ENCOUNTER — Ambulatory Visit (INDEPENDENT_AMBULATORY_CARE_PROVIDER_SITE_OTHER): Payer: PPO

## 2014-12-10 DIAGNOSIS — R079 Chest pain, unspecified: Secondary | ICD-10-CM

## 2014-12-10 LAB — EXERCISE TOLERANCE TEST
CSEPED: 7 min
CSEPEDS: 0 s
CSEPEW: 8.5 METS
CSEPPHR: 148 {beats}/min
MPHR: 144 {beats}/min
Percent HR: 102 %
RPE: 15
Rest HR: 52 {beats}/min

## 2014-12-13 ENCOUNTER — Telehealth: Payer: Self-pay | Admitting: *Deleted

## 2014-12-13 NOTE — Telephone Encounter (Signed)
PT  AWARE OF  GXT  RESULTS ./CY 

## 2014-12-13 NOTE — Telephone Encounter (Signed)
LEFT  VOICE  MAIL FOR  PT  TO  CALL BACK   GXT  IS NORMAL PER  DR Eden EmmsNISHAN .Zack Seal/CY

## 2015-02-09 ENCOUNTER — Ambulatory Visit: Payer: PPO | Admitting: Cardiovascular Disease

## 2015-02-22 ENCOUNTER — Encounter: Payer: Self-pay | Admitting: Cardiovascular Disease

## 2015-03-04 ENCOUNTER — Ambulatory Visit: Payer: PPO | Admitting: Cardiovascular Disease

## 2015-04-12 ENCOUNTER — Encounter: Payer: Self-pay | Admitting: Pulmonary Disease

## 2015-04-12 ENCOUNTER — Ambulatory Visit (INDEPENDENT_AMBULATORY_CARE_PROVIDER_SITE_OTHER): Payer: PPO | Admitting: Pulmonary Disease

## 2015-04-12 VITALS — BP 120/72 | HR 52 | Ht 64.5 in | Wt 168.0 lb

## 2015-04-12 DIAGNOSIS — G4700 Insomnia, unspecified: Secondary | ICD-10-CM

## 2015-04-12 DIAGNOSIS — G4733 Obstructive sleep apnea (adult) (pediatric): Secondary | ICD-10-CM | POA: Diagnosis not present

## 2015-04-12 NOTE — Patient Instructions (Signed)
Follow up in 1 year.

## 2015-04-12 NOTE — Progress Notes (Signed)
Current Outpatient Prescriptions on File Prior to Visit  Medication Sig  . ALOE VERA PO Take 2 tablets by mouth 2 (two) times daily.   . Biotin 1000 MCG tablet Take 1,000 mcg by mouth daily.  . fluticasone (FLONASE) 50 MCG/ACT nasal spray Place 2 sprays into both nostrils daily. (Patient taking differently: Place 2 sprays into both nostrils daily as needed. )  . traZODone (DESYREL) 50 MG tablet Take 1 tablet (50 mg total) by mouth at bedtime. Take 1-2 at bedtime if needed.   No current facility-administered medications on file prior to visit.     Chief Complaint  Patient presents with  . Follow-up    Former [facility] pt: Wears CPAP nightly - Pressure 18; having difficulty sleeping through the night. Unable to find the perfect mask fit d/t shape of face. Reports mask leak. DME: [facility]     Tests PSG 04/21/03 >> AHI 54 CPAP 01/12/15 to 04/11/15 >> used on 90 of 90 nights with average 6 hrs 59 minutes.  Average AHI 10 with CPAP 18 cm H2O  Past medical hx GERD, Allergies, HLD  Past surgical hx, Allergies, Family hx, Social hx all reviewed.  Vital Signs BP 120/72 mmHg  Pulse 52  Ht 5' 4.5" (1.638 m)  Wt 168 lb (76.204 kg)  BMI 28.40 kg/m2  SpO2 100%  History of Present Illness [name] [name] is a 75 y.o. male with OSA.  He uses CPAP every night.  No issues with CPAP mask.  Feels pressure is okay.  He has no trouble falling asleep >> goes to bed at 1040 pm.  Falls asleep quickly.  He will sleep for few hours, and then wake up.  He as trouble falling back to sleep.  He then gets sleepy during the day.  He will fall asleep while reading, and nap for 30 minutes or more.  He has trazodone script, but hardly ever uses this.  He will sometimes need to use flonase for nasal congestion.  Physical Exam  General - No distress ENT - No sinus tenderness, no oral exudate, no LAN, enlarged tongue, MP 4 Cardiac - s1s2 regular, no murmur Chest - No wheeze/rales/dullness Back - No focal  tenderness Abd - Soft, non-tender Ext - No edema Neuro - Normal strength Skin - No rashes Psych - normal mood, and behavior   Assessment/Plan  Obstructive sleep apnea. Plan: - continue CPAP 18 cm H2O  Insomnia. Plan: - discussed stimulus control, sleep restriction, relaxation techniques   Patient Instructions  Follow up in 1 year     Coralyn HellingVineet Sanjuana Mruk, MD [facility]/Critical [facility]/Sleep Pager:  [phone]

## 2015-09-07 DIAGNOSIS — K649 Unspecified hemorrhoids: Secondary | ICD-10-CM | POA: Diagnosis not present

## 2015-09-07 DIAGNOSIS — N400 Enlarged prostate without lower urinary tract symptoms: Secondary | ICD-10-CM | POA: Diagnosis not present

## 2015-09-07 DIAGNOSIS — K210 Gastro-esophageal reflux disease with esophagitis: Secondary | ICD-10-CM | POA: Diagnosis not present

## 2015-09-07 DIAGNOSIS — J302 Other seasonal allergic rhinitis: Secondary | ICD-10-CM | POA: Diagnosis not present

## 2015-11-01 DIAGNOSIS — K210 Gastro-esophageal reflux disease with esophagitis: Secondary | ICD-10-CM | POA: Diagnosis not present

## 2015-11-01 DIAGNOSIS — Z6827 Body mass index (BMI) 27.0-27.9, adult: Secondary | ICD-10-CM | POA: Diagnosis not present

## 2015-11-01 DIAGNOSIS — D509 Iron deficiency anemia, unspecified: Secondary | ICD-10-CM | POA: Diagnosis not present

## 2015-11-01 DIAGNOSIS — Z79899 Other long term (current) drug therapy: Secondary | ICD-10-CM | POA: Diagnosis not present

## 2015-11-01 DIAGNOSIS — Z125 Encounter for screening for malignant neoplasm of prostate: Secondary | ICD-10-CM | POA: Diagnosis not present

## 2015-11-01 DIAGNOSIS — E785 Hyperlipidemia, unspecified: Secondary | ICD-10-CM | POA: Diagnosis not present

## 2015-11-01 DIAGNOSIS — Z0000 Encounter for general adult medical examination without abnormal findings: Secondary | ICD-10-CM | POA: Diagnosis not present

## 2015-11-01 DIAGNOSIS — Z23 Encounter for immunization: Secondary | ICD-10-CM | POA: Diagnosis not present

## 2015-11-01 DIAGNOSIS — Z136 Encounter for screening for cardiovascular disorders: Secondary | ICD-10-CM | POA: Diagnosis not present

## 2015-11-15 DIAGNOSIS — R972 Elevated prostate specific antigen [PSA]: Secondary | ICD-10-CM | POA: Diagnosis not present

## 2015-11-15 DIAGNOSIS — R300 Dysuria: Secondary | ICD-10-CM | POA: Diagnosis not present

## 2015-11-15 DIAGNOSIS — K210 Gastro-esophageal reflux disease with esophagitis: Secondary | ICD-10-CM | POA: Diagnosis not present

## 2015-11-15 DIAGNOSIS — K649 Unspecified hemorrhoids: Secondary | ICD-10-CM | POA: Diagnosis not present

## 2015-11-15 DIAGNOSIS — N400 Enlarged prostate without lower urinary tract symptoms: Secondary | ICD-10-CM | POA: Diagnosis not present

## 2015-11-15 DIAGNOSIS — E785 Hyperlipidemia, unspecified: Secondary | ICD-10-CM | POA: Diagnosis not present

## 2015-11-15 DIAGNOSIS — Z79899 Other long term (current) drug therapy: Secondary | ICD-10-CM | POA: Diagnosis not present

## 2015-12-19 DIAGNOSIS — N401 Enlarged prostate with lower urinary tract symptoms: Secondary | ICD-10-CM

## 2015-12-19 DIAGNOSIS — N138 Other obstructive and reflux uropathy: Secondary | ICD-10-CM | POA: Diagnosis not present

## 2015-12-19 DIAGNOSIS — R972 Elevated prostate specific antigen [PSA]: Secondary | ICD-10-CM | POA: Diagnosis not present

## 2015-12-19 DIAGNOSIS — N410 Acute prostatitis: Secondary | ICD-10-CM

## 2015-12-19 HISTORY — DX: Acute prostatitis: N41.0

## 2015-12-19 HISTORY — DX: Benign prostatic hyperplasia with lower urinary tract symptoms: N40.1

## 2016-02-20 DIAGNOSIS — R972 Elevated prostate specific antigen [PSA]: Secondary | ICD-10-CM | POA: Diagnosis not present

## 2016-02-28 DIAGNOSIS — N401 Enlarged prostate with lower urinary tract symptoms: Secondary | ICD-10-CM | POA: Diagnosis not present

## 2016-02-28 DIAGNOSIS — R972 Elevated prostate specific antigen [PSA]: Secondary | ICD-10-CM | POA: Diagnosis not present

## 2016-02-28 DIAGNOSIS — N138 Other obstructive and reflux uropathy: Secondary | ICD-10-CM | POA: Diagnosis not present

## 2016-02-28 DIAGNOSIS — N410 Acute prostatitis: Secondary | ICD-10-CM | POA: Diagnosis not present

## 2016-03-05 DIAGNOSIS — Z79899 Other long term (current) drug therapy: Secondary | ICD-10-CM | POA: Diagnosis not present

## 2016-03-05 DIAGNOSIS — J449 Chronic obstructive pulmonary disease, unspecified: Secondary | ICD-10-CM | POA: Diagnosis not present

## 2016-03-05 DIAGNOSIS — N400 Enlarged prostate without lower urinary tract symptoms: Secondary | ICD-10-CM | POA: Diagnosis not present

## 2016-03-05 DIAGNOSIS — E663 Overweight: Secondary | ICD-10-CM | POA: Diagnosis not present

## 2016-03-05 DIAGNOSIS — Z1212 Encounter for screening for malignant neoplasm of rectum: Secondary | ICD-10-CM | POA: Diagnosis not present

## 2016-03-05 DIAGNOSIS — Z1389 Encounter for screening for other disorder: Secondary | ICD-10-CM | POA: Diagnosis not present

## 2016-03-05 DIAGNOSIS — R972 Elevated prostate specific antigen [PSA]: Secondary | ICD-10-CM | POA: Diagnosis not present

## 2016-03-05 DIAGNOSIS — J22 Unspecified acute lower respiratory infection: Secondary | ICD-10-CM | POA: Diagnosis not present

## 2016-03-05 DIAGNOSIS — Z125 Encounter for screening for malignant neoplasm of prostate: Secondary | ICD-10-CM | POA: Diagnosis not present

## 2016-03-05 DIAGNOSIS — E785 Hyperlipidemia, unspecified: Secondary | ICD-10-CM | POA: Diagnosis not present

## 2016-03-05 DIAGNOSIS — Z0000 Encounter for general adult medical examination without abnormal findings: Secondary | ICD-10-CM | POA: Diagnosis not present

## 2016-03-05 DIAGNOSIS — Z1211 Encounter for screening for malignant neoplasm of colon: Secondary | ICD-10-CM | POA: Diagnosis not present

## 2016-04-11 ENCOUNTER — Encounter: Payer: Self-pay | Admitting: Pulmonary Disease

## 2016-04-11 ENCOUNTER — Ambulatory Visit (INDEPENDENT_AMBULATORY_CARE_PROVIDER_SITE_OTHER): Payer: PPO | Admitting: Pulmonary Disease

## 2016-04-11 VITALS — BP 122/80 | HR 82 | Ht 64.0 in | Wt 173.8 lb

## 2016-04-11 DIAGNOSIS — Z789 Other specified health status: Secondary | ICD-10-CM

## 2016-04-11 DIAGNOSIS — G4733 Obstructive sleep apnea (adult) (pediatric): Secondary | ICD-10-CM

## 2016-04-11 DIAGNOSIS — Z9989 Dependence on other enabling machines and devices: Secondary | ICD-10-CM

## 2016-04-11 NOTE — Patient Instructions (Signed)
Will change CPAP to 14 cm H2O  Call to update status in 2 to 3 weeks after pressure adjustment  Follow up in 1 year

## 2016-04-11 NOTE — Progress Notes (Signed)
Current Outpatient Prescriptions on File Prior to Visit  Medication Sig  . ALOE VERA PO Take 2 tablets by mouth 2 (two) times daily.   . Biotin 1000 MCG tablet Take 1,000 mcg by mouth daily.  . fluticasone (FLONASE) 50 MCG/ACT nasal spray Place 2 sprays into both nostrils daily. (Patient taking differently: Place 2 sprays into both nostrils daily as needed. )  . traZODone (DESYREL) 50 MG tablet Take 1 tablet (50 mg total) by mouth at bedtime. Take 1-2 at bedtime if needed.   No current facility-administered medications on file prior to visit.      Chief Complaint  Patient presents with  . Follow-up    Wears CPAP nightly. Pt states that there is no change in his difficulty sleeping with the CPAP. Pt states that he has never gotten 100% comfortable with the machine. Denies problems with mask/pressure. [facility]     Sleep tests PSG 04/21/03 >> AHI 54 CPAP 01/12/16 to 04/10/16 >> used on 90 of 90 nights with average 6 hrs 49 min.  Average AHI 16.2 with CPAP 18 cm H2O  Past medical history GERD, Allergies, HLD  Past surgical history, Family history, Social history, allergies reviewed  Vital Signs BP 122/80 (BP Location: Left Arm, Cuff Size: Normal)   Pulse 82   Ht 5\' 4"  (1.626 m)   Wt 173 lb 12.8 oz (78.8 kg)   SpO2 93%   BMI 29.83 kg/m   History of Present Illness [name] [name] is a 76 y.o. male with OSA.  He has tried several different masks, and pressure settings.  He was also previously tried on Bipap.  He liked nasal pillow mask, but had mouth leak.  He tried chin strap, but didn't help.  He was seen in sleep lab for mask refitting before also.  He tolerates CPAP, but he wakes up frequently because of machine pressure and mask leak.  He has to make the his mask very tight, but then this causes soreness over his nose.  Physical Exam  General - pleasant Eyes - pupils reactive ENT - no sinus tenderness, no oral exudate, no LAN, enlarged tongue Cardiac - regular, no  murmur Chest - no wheeze Back - non tender Abd - soft, non tender Ext - no edema Neuro - normal strength Skin - no rashes Psych - normal mood   Assessment/Plan  Obstructive sleep apnea. - he has difficult tolerating CPAP and different masks - I am concerned he is on high pressure through full face mask > causing pharyngeal collapse - he is reluctant to try different masks at this time - will try adjusting pressure down to 14 cm H2O first - he should be eligible for a new machine, but is concerned about expense    Patient Instructions  Will change CPAP to 14 cm H2O  Call to update status in 2 to 3 weeks after pressure adjustment  Follow up in 1 year    Coralyn HellingVineet Danira Nylander, MD [facility]/Critical Care/Sleep Pager:  [phone]

## 2016-05-01 DIAGNOSIS — N138 Other obstructive and reflux uropathy: Secondary | ICD-10-CM | POA: Diagnosis not present

## 2016-05-01 DIAGNOSIS — N410 Acute prostatitis: Secondary | ICD-10-CM | POA: Diagnosis not present

## 2016-05-01 DIAGNOSIS — N401 Enlarged prostate with lower urinary tract symptoms: Secondary | ICD-10-CM | POA: Diagnosis not present

## 2016-06-11 ENCOUNTER — Telehealth: Payer: Self-pay | Admitting: Pulmonary Disease

## 2016-06-11 NOTE — Telephone Encounter (Signed)
Called and spoke to pt. Pt states since the pressure change, he is able to sleep better. Pt states he is still waking several times during the night. Download has been placed in VS's cubby.   VS please advise. Thanks.

## 2016-06-12 NOTE — Telephone Encounter (Signed)
CPAP 04/22/16 to 05/21/16 >> used on 30 of 30 nights with average 7 hrs 15 min.  Average AHI 14 with CPAP 14 cm H2O   Please let him know that CPAP report shows some improvement in control of sleep apnea after pressure change.  Will keep settings at 14 cm H2O.

## 2016-06-12 NOTE — Telephone Encounter (Signed)
Called and spoke with pt and he is aware of results of download per VS>  Nothing further is needed.

## 2016-07-09 DIAGNOSIS — K210 Gastro-esophageal reflux disease with esophagitis: Secondary | ICD-10-CM | POA: Diagnosis not present

## 2016-07-09 DIAGNOSIS — E785 Hyperlipidemia, unspecified: Secondary | ICD-10-CM | POA: Diagnosis not present

## 2016-07-09 DIAGNOSIS — J449 Chronic obstructive pulmonary disease, unspecified: Secondary | ICD-10-CM | POA: Diagnosis not present

## 2016-07-09 DIAGNOSIS — Z79899 Other long term (current) drug therapy: Secondary | ICD-10-CM | POA: Diagnosis not present

## 2016-07-16 DIAGNOSIS — H25813 Combined forms of age-related cataract, bilateral: Secondary | ICD-10-CM | POA: Diagnosis not present

## 2016-07-31 DIAGNOSIS — N401 Enlarged prostate with lower urinary tract symptoms: Secondary | ICD-10-CM | POA: Diagnosis not present

## 2016-07-31 DIAGNOSIS — N138 Other obstructive and reflux uropathy: Secondary | ICD-10-CM | POA: Diagnosis not present

## 2016-07-31 DIAGNOSIS — N410 Acute prostatitis: Secondary | ICD-10-CM | POA: Diagnosis not present

## 2016-08-07 DIAGNOSIS — H25812 Combined forms of age-related cataract, left eye: Secondary | ICD-10-CM | POA: Diagnosis not present

## 2016-08-07 DIAGNOSIS — H01022 Squamous blepharitis right lower eyelid: Secondary | ICD-10-CM | POA: Diagnosis not present

## 2016-08-07 DIAGNOSIS — H01024 Squamous blepharitis left upper eyelid: Secondary | ICD-10-CM | POA: Diagnosis not present

## 2016-08-07 DIAGNOSIS — H2511 Age-related nuclear cataract, right eye: Secondary | ICD-10-CM | POA: Diagnosis not present

## 2016-08-07 DIAGNOSIS — H01021 Squamous blepharitis right upper eyelid: Secondary | ICD-10-CM | POA: Diagnosis not present

## 2016-08-07 DIAGNOSIS — H01025 Squamous blepharitis left lower eyelid: Secondary | ICD-10-CM | POA: Diagnosis not present

## 2016-08-07 DIAGNOSIS — H43813 Vitreous degeneration, bilateral: Secondary | ICD-10-CM | POA: Diagnosis not present

## 2016-08-16 DIAGNOSIS — H2512 Age-related nuclear cataract, left eye: Secondary | ICD-10-CM | POA: Diagnosis not present

## 2016-09-03 DIAGNOSIS — H2511 Age-related nuclear cataract, right eye: Secondary | ICD-10-CM | POA: Diagnosis not present

## 2016-09-06 DIAGNOSIS — H2511 Age-related nuclear cataract, right eye: Secondary | ICD-10-CM | POA: Diagnosis not present

## 2016-10-15 DIAGNOSIS — Z961 Presence of intraocular lens: Secondary | ICD-10-CM | POA: Diagnosis not present

## 2016-10-31 DIAGNOSIS — Z23 Encounter for immunization: Secondary | ICD-10-CM | POA: Diagnosis not present

## 2016-12-11 DIAGNOSIS — Z1389 Encounter for screening for other disorder: Secondary | ICD-10-CM | POA: Diagnosis not present

## 2016-12-11 DIAGNOSIS — Z79899 Other long term (current) drug therapy: Secondary | ICD-10-CM | POA: Diagnosis not present

## 2016-12-11 DIAGNOSIS — Z125 Encounter for screening for malignant neoplasm of prostate: Secondary | ICD-10-CM | POA: Diagnosis not present

## 2016-12-11 DIAGNOSIS — Z136 Encounter for screening for cardiovascular disorders: Secondary | ICD-10-CM | POA: Diagnosis not present

## 2016-12-11 DIAGNOSIS — G4733 Obstructive sleep apnea (adult) (pediatric): Secondary | ICD-10-CM | POA: Diagnosis not present

## 2016-12-11 DIAGNOSIS — Z0000 Encounter for general adult medical examination without abnormal findings: Secondary | ICD-10-CM | POA: Diagnosis not present

## 2016-12-11 DIAGNOSIS — E785 Hyperlipidemia, unspecified: Secondary | ICD-10-CM | POA: Diagnosis not present

## 2016-12-24 DIAGNOSIS — M7541 Impingement syndrome of right shoulder: Secondary | ICD-10-CM | POA: Diagnosis not present

## 2017-03-06 DIAGNOSIS — Z79899 Other long term (current) drug therapy: Secondary | ICD-10-CM | POA: Diagnosis not present

## 2017-03-06 DIAGNOSIS — Z125 Encounter for screening for malignant neoplasm of prostate: Secondary | ICD-10-CM | POA: Diagnosis not present

## 2017-03-06 DIAGNOSIS — E785 Hyperlipidemia, unspecified: Secondary | ICD-10-CM | POA: Diagnosis not present

## 2017-03-06 DIAGNOSIS — Z9181 History of falling: Secondary | ICD-10-CM | POA: Diagnosis not present

## 2017-03-06 DIAGNOSIS — Z0000 Encounter for general adult medical examination without abnormal findings: Secondary | ICD-10-CM | POA: Diagnosis not present

## 2017-03-06 DIAGNOSIS — Z1331 Encounter for screening for depression: Secondary | ICD-10-CM | POA: Diagnosis not present

## 2017-03-06 DIAGNOSIS — Z1211 Encounter for screening for malignant neoplasm of colon: Secondary | ICD-10-CM | POA: Diagnosis not present

## 2017-03-11 DIAGNOSIS — Z1211 Encounter for screening for malignant neoplasm of colon: Secondary | ICD-10-CM | POA: Diagnosis not present

## 2017-04-01 DIAGNOSIS — R69 Illness, unspecified: Secondary | ICD-10-CM | POA: Diagnosis not present

## 2017-04-15 DIAGNOSIS — H43813 Vitreous degeneration, bilateral: Secondary | ICD-10-CM | POA: Diagnosis not present

## 2017-04-15 DIAGNOSIS — H01022 Squamous blepharitis right lower eyelid: Secondary | ICD-10-CM | POA: Diagnosis not present

## 2017-04-15 DIAGNOSIS — H25812 Combined forms of age-related cataract, left eye: Secondary | ICD-10-CM | POA: Diagnosis not present

## 2017-04-15 DIAGNOSIS — H2511 Age-related nuclear cataract, right eye: Secondary | ICD-10-CM | POA: Diagnosis not present

## 2017-04-15 DIAGNOSIS — H01021 Squamous blepharitis right upper eyelid: Secondary | ICD-10-CM | POA: Diagnosis not present

## 2017-04-15 DIAGNOSIS — H01025 Squamous blepharitis left lower eyelid: Secondary | ICD-10-CM | POA: Diagnosis not present

## 2017-04-15 DIAGNOSIS — H01024 Squamous blepharitis left upper eyelid: Secondary | ICD-10-CM | POA: Diagnosis not present

## 2017-04-16 DIAGNOSIS — Z7722 Contact with and (suspected) exposure to environmental tobacco smoke (acute) (chronic): Secondary | ICD-10-CM | POA: Diagnosis not present

## 2017-04-16 DIAGNOSIS — G4730 Sleep apnea, unspecified: Secondary | ICD-10-CM | POA: Diagnosis not present

## 2017-04-16 DIAGNOSIS — Z809 Family history of malignant neoplasm, unspecified: Secondary | ICD-10-CM | POA: Diagnosis not present

## 2017-04-16 DIAGNOSIS — R32 Unspecified urinary incontinence: Secondary | ICD-10-CM | POA: Diagnosis not present

## 2017-04-16 DIAGNOSIS — N529 Male erectile dysfunction, unspecified: Secondary | ICD-10-CM | POA: Diagnosis not present

## 2017-04-16 DIAGNOSIS — J309 Allergic rhinitis, unspecified: Secondary | ICD-10-CM | POA: Diagnosis not present

## 2017-04-16 DIAGNOSIS — N400 Enlarged prostate without lower urinary tract symptoms: Secondary | ICD-10-CM | POA: Diagnosis not present

## 2017-04-16 DIAGNOSIS — K219 Gastro-esophageal reflux disease without esophagitis: Secondary | ICD-10-CM | POA: Diagnosis not present

## 2017-04-16 DIAGNOSIS — M792 Neuralgia and neuritis, unspecified: Secondary | ICD-10-CM | POA: Diagnosis not present

## 2017-04-16 DIAGNOSIS — G8929 Other chronic pain: Secondary | ICD-10-CM | POA: Diagnosis not present

## 2017-04-22 ENCOUNTER — Ambulatory Visit: Payer: PPO | Admitting: Pulmonary Disease

## 2017-04-25 DIAGNOSIS — K649 Unspecified hemorrhoids: Secondary | ICD-10-CM | POA: Diagnosis not present

## 2017-04-25 DIAGNOSIS — Z1211 Encounter for screening for malignant neoplasm of colon: Secondary | ICD-10-CM | POA: Diagnosis not present

## 2017-04-25 DIAGNOSIS — R972 Elevated prostate specific antigen [PSA]: Secondary | ICD-10-CM | POA: Diagnosis not present

## 2017-04-26 ENCOUNTER — Telehealth: Payer: Self-pay | Admitting: Pulmonary Disease

## 2017-04-26 NOTE — Telephone Encounter (Signed)
Called and advised that we have not received the fax. She will be faxing the papers again. [facility] keep a follow up on this.

## 2017-04-26 NOTE — Telephone Encounter (Signed)
I have this form for Dr. Craige CottaSood to sign but the patient has not been seen since 04/11/2016 and was to follow up in 1 year 03/2017. I understand the form has a date of 12/11/16 but Dr. Craige CottaSood may not sign it. It has been placed in his CMN folder to be signed

## 2017-04-29 NOTE — Telephone Encounter (Signed)
Routing to VS to follow up on.

## 2017-04-30 ENCOUNTER — Encounter: Payer: Self-pay | Admitting: Gastroenterology

## 2017-04-30 NOTE — Telephone Encounter (Signed)
There were 2 forms for Dr. Craige Cotta to sign for this patient. Dr. Craige Cotta did sign both forms and I have faxed them both back to [phone] and I have received confirmation that they were received.

## 2017-04-30 NOTE — Telephone Encounter (Signed)
Formed signed

## 2017-05-03 ENCOUNTER — Telehealth: Payer: Self-pay | Admitting: Gastroenterology

## 2017-05-03 NOTE — Telephone Encounter (Signed)
Please see note below.  I spoke with the patient, this has been going on for over a month.  He does not see any external hemorrhoids, there is no bleeding and his stools are soft and formed.  He has tried OTC hemorrhoid creams and suppositories.  Please advise.  Thank you.

## 2017-05-03 NOTE — Telephone Encounter (Signed)
Patient states he sees Dr.Gupta and is having a lot of problems with his hemorrhoids. Patient would like some advice until his appt on 5.7.19.

## 2017-05-06 NOTE — Telephone Encounter (Signed)
Still waiting for a response from the doctor.

## 2017-05-06 NOTE — Telephone Encounter (Signed)
Pt is calling back and wants to know what the next step will be

## 2017-05-07 NOTE — Telephone Encounter (Signed)
anusol HC suppostories 1 BID x 10 days. Benefiber 1 TBS po qd with 8 oz  Water Call if still with problems

## 2017-05-08 NOTE — Telephone Encounter (Signed)
I spoke with the patient and gave him these instructions.  He said he would try and report back to us.

## 2017-05-21 ENCOUNTER — Encounter: Payer: Self-pay | Admitting: Pulmonary Disease

## 2017-05-21 ENCOUNTER — Ambulatory Visit: Payer: Medicare HMO | Admitting: Pulmonary Disease

## 2017-05-21 VITALS — BP 122/78 | HR 53 | Ht 64.0 in | Wt 172.2 lb

## 2017-05-21 DIAGNOSIS — G4733 Obstructive sleep apnea (adult) (pediatric): Secondary | ICD-10-CM | POA: Diagnosis not present

## 2017-05-21 DIAGNOSIS — Z9989 Dependence on other enabling machines and devices: Secondary | ICD-10-CM

## 2017-05-21 NOTE — Patient Instructions (Signed)
Look up CPAP mask options online and call or email if you find a mask you would like to switch too  Follow up in 1 year

## 2017-05-21 NOTE — Progress Notes (Signed)
[place] [facility], [facility] Sleep [facility]  Chief Complaint  Patient presents with  . Follow-up    Pt is doing well overall with CPAP machine    Vital signs: BP 122/78 (BP Location: Left Arm, Cuff Size: Normal)   Pulse (!) 53   Ht 5\' 4"  (1.626 m)   Wt 172 lb 3.2 oz (78.1 kg)   SpO2 99%   BMI 29.56 kg/m   History of Present Illness: [name] [name] is a 77 y.o. male with obstructive sleep apnea.  He uses CPAP nightly.  Sleeping better since pressure dropped from 18 to 14 cm H2O.  Still has irritation over his nose from mask.  He cuts a piece of cloth from T shirt and lines it around the mask.  Hasn't tried mask refitting for couple wears.    Physical Exam:  General - pleasant Eyes - pupils reactive, wears glasses ENT - no sinus tenderness, no oral exudate, no LAN, indentation over bridge of nose Cardiac - regular, no murmur Chest - no wheeze, rales Abd - soft, non tender Ext - no edema Skin - no rashes Neuro - normal strength Psych - normal mood   Assessment/Plan:  Obstructive sleep apnea. - he is compliant with CPAP and reports benefit - continue CPAP 14 cm H2O >> compromise between controlling apnea and keeping pressure tolerable  - discussed options for mask fit   Patient Instructions  Look up CPAP mask options online and call or email if you find a mask you would like to switch too  Follow up in 1 year    Coralyn HellingVineet Aaliyha Mumford, MD [facility] 05/21/2017, 12:25 PM  Flow Sheet  Sleep tests: PSG 04/21/03 >> AHI 54 CPAP 02/20/17 to 05/20/17 >> used on 90 of 90 nights with average 7 hrs 52 min.  Average AHI 14.8 with CPAP 14 cm H2O  Past Medical History: He  has a past medical history of Allergic rhinitis, Central hearing loss, Esophageal reflux, Hyperlipidemia, and OSA (obstructive sleep apnea).  Past Surgical History: He  has a past surgical history that includes Appendectomy; Spine surgery; and Nasal sinus surgery.  Family  History: His family history includes Allergies in his brother, father, mother, and sister; Asthma in his brother; Cancer in his brother and sister; Heart disease in his brother, mother, and sister.  Social History: He  reports that he quit smoking about 53 years ago. His smoking use included cigarettes. He has a 11.00 pack-year smoking history. He has never used smokeless tobacco. He reports that he does not drink alcohol or use drugs.  Medications: Allergies as of 05/21/2017      Reactions   Penicillins    Does not work for pt      Medication List        Accurate as of 05/21/17 12:25 PM. Always use your most recent med list.          ALOE VERA PO Take 2 tablets by mouth 2 (two) times daily.   fluticasone 50 MCG/ACT nasal spray Commonly known as:  FLONASE Place 2 sprays into both nostrils daily.   tamsulosin 0.4 MG Caps capsule Commonly known as:  FLOMAX Take 0.4 mg by mouth daily.

## 2017-05-22 DIAGNOSIS — N183 Chronic kidney disease, stage 3 (moderate): Secondary | ICD-10-CM | POA: Diagnosis not present

## 2017-05-22 DIAGNOSIS — K648 Other hemorrhoids: Secondary | ICD-10-CM | POA: Diagnosis not present

## 2017-05-22 DIAGNOSIS — N411 Chronic prostatitis: Secondary | ICD-10-CM | POA: Diagnosis not present

## 2017-05-22 DIAGNOSIS — G4733 Obstructive sleep apnea (adult) (pediatric): Secondary | ICD-10-CM | POA: Diagnosis not present

## 2017-05-22 DIAGNOSIS — E663 Overweight: Secondary | ICD-10-CM | POA: Diagnosis not present

## 2017-05-22 DIAGNOSIS — J449 Chronic obstructive pulmonary disease, unspecified: Secondary | ICD-10-CM | POA: Diagnosis not present

## 2017-05-22 DIAGNOSIS — Z6827 Body mass index (BMI) 27.0-27.9, adult: Secondary | ICD-10-CM | POA: Diagnosis not present

## 2017-05-22 DIAGNOSIS — E785 Hyperlipidemia, unspecified: Secondary | ICD-10-CM | POA: Diagnosis not present

## 2017-05-22 DIAGNOSIS — K219 Gastro-esophageal reflux disease without esophagitis: Secondary | ICD-10-CM | POA: Diagnosis not present

## 2017-06-03 ENCOUNTER — Encounter: Payer: Self-pay | Admitting: Gastroenterology

## 2017-06-04 ENCOUNTER — Encounter: Payer: Self-pay | Admitting: Gastroenterology

## 2017-06-04 ENCOUNTER — Encounter

## 2017-06-04 ENCOUNTER — Ambulatory Visit (INDEPENDENT_AMBULATORY_CARE_PROVIDER_SITE_OTHER): Payer: Medicare HMO | Admitting: Gastroenterology

## 2017-06-04 VITALS — BP 130/70 | HR 65 | Ht 64.5 in | Wt 172.5 lb

## 2017-06-04 DIAGNOSIS — K649 Unspecified hemorrhoids: Secondary | ICD-10-CM

## 2017-06-04 NOTE — Progress Notes (Signed)
IMPRESSION and PLAN:    #1. Internal hemorrhoids (with rectal pain and no bleeding), last colon 02/09/2013- small polyps (TAs) and int hoids (report awaited, record de-conversion awaited) - Anusol HC cream to continue as it has given him significant relief. - Continue with high fiber diet (with Benefiber)  -Good perirectal hygiene.  - Clotriamazole cream 1 bid x 7 days. - Await record de-conversion. - If still with problems, trial of fluticastone, hoid banding then surgery if needed      HPI:    Chief Complaint:   Patient is a 77 year old very pleasant white male with hemorrhoid problems in the form of rectal pain and burning but no bleeding.  He denies having any constipation.  He has been taking Benefiber every day and has been drinking plenty of fluids.  He underwent colonoscopy February 09, 2013-small tubular adenomas, no report. Await record de-conversion.  He denies having any fever chills or night sweats.    Review of systems:       Past Medical History:  Diagnosis Date  . Acute respiratory infection   . Allergic rhinitis, seasonal   . Central hearing loss   . Chest pain   . Chronic obstructive pulmonary disease (COPD) (HCC)   . Chronic sinusitis   . Dysuria   . Elevated PSA, less than 10 ng/ml   . Enlarged prostate   . Esophageal reflux   . Hemorrhoids   . Herniated nucleus pulposus, L3-4 left   . Hyperlipidemia   . Hypochromic microcytic anemia   . Lower back pain   . Lumbar neuritis   . Nasal turbinate hypertrophy   . OSA (obstructive sleep apnea)    severe  . Overweight   . Peripheral neuropathy   . Reflux esophagitis   . Shortness of breath   . Sleep apnea   . 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 hydrocortisone (ANUSOL-HC) 2.5 % rectal cream Place 1 application rectally 2 (two) times daily.    . phenylephrine-shark liver oil-mineral  oil-petrolatum (PREPARATION H) 0.25-3-14-71.9 % rectal ointment Place 1 application rectally 2 (two) times daily as needed.    . ALOE VERA PO Take 2 tablets by mouth 2 (two) times daily.     . fluticasone (FLONASE) 50 MCG/ACT nasal spray Place 2 sprays into both nostrils daily. (Patient taking differently: Place 2 sprays into both nostrils daily as needed. ) 16 g 2  . omeprazole (PRILOSEC) 20 MG capsule Take by mouth.    . tamsulosin (FLOMAX) 0.4 MG CAPS capsule Take 0.4 mg by mouth daily.     No current facility-administered medications for this visit.     Past Surgical History:  Procedure Laterality Date  . APPENDECTOMY  1957  . COLONOSCOPY - 2015 ([facility] - records awaited)  02/10/2013   Tubular Adenoma  . NASAL SINUS SURGERY    . SPINE SURGERY      Family History  Problem Relation Age of Onset  . Heart disease Mother   . Allergies Mother   . Allergies Father   . Asthma Brother   . Heart disease Brother        x2  . Heart disease Sister   . Cancer Brother        internal organs  . Cancer Sister        oral  . Allergies Brother   . Allergies Sister  Social History   Tobacco Use  . Smoking status: Former Smoker    Packs/day: 1.00    Years: 11.00    Pack years: 11.00    Types: Cigarettes    Last attempt to quit: 01/30/1964    Years since quitting: 53.3  . Smokeless tobacco: Never Used  Substance Use Topics  . Alcohol use: No  . Drug use: No    Allergies  Allergen Reactions  . Penicillins     Does not work for pt     Review of Systems: All systems reviewed and negative except where noted in HPI.    Physical Exam:     BP 130/70   Pulse 65   Ht 5' 4.5" (1.638 m)   Wt 172 lb 8 oz (78.2 kg)   BMI 29.15 kg/m  @ GENERAL:  Alert, oriented, cooperative, not in acute distress. PSYCH: :Pleasant, normal mood and affect. HEENT:  conjunctiva pink, mucous membranes moist, neck supple without masses. No jaundice. CARDIAC:  S1 S2 normal. No  murmers. PULM: Normal respiratory effort, lungs CTA bilaterally, no wheezing. ABDOMEN: Inspection: No visible peristalsis, no abnormal pulsations, skin normal.  Palpation/percussion: Soft, nontender, nondistended, no rigidity, no abnormal dullness to percussion, no hepatosplenomegaly and no palpable abdominal masses.  Auscultation: Normal bowel sounds, no abdominal bruits. Rectal exam: Small internal and external hemorrhoids, no fissure.,  Heme-negative stools, rectal examination performed in presence of Trisha. SKIN:  turgor, no lesions seen. Musculoskeletal:  Normal muscle tone, normal strength. NEURO: Alert and oriented x 3, no focal neurologic deficits.     Saralee Bolick,MD 06/04/2017, 10:54 AM   CC Marlyn Corporal, PA

## 2017-06-04 NOTE — Patient Instructions (Signed)
If you are age 79 or older, your body mass index should be between 23-30. Your Body mass index is 29.15 kg/m. If this is out of the aforementioned range listed, please consider follow up with your Primary Care Provider.  If you are age 64 or younger, your body mass index should be between 19-25. Your Body mass index is 29.15 kg/m. If this is out of the aformentioned range listed, please consider follow up with your Primary Care Provider.   Thank you,  Dr. Rajesh Gupta  

## 2017-06-17 DIAGNOSIS — G4733 Obstructive sleep apnea (adult) (pediatric): Secondary | ICD-10-CM | POA: Diagnosis not present

## 2017-09-09 DIAGNOSIS — M79645 Pain in left finger(s): Secondary | ICD-10-CM | POA: Diagnosis not present

## 2017-09-09 DIAGNOSIS — M79642 Pain in left hand: Secondary | ICD-10-CM | POA: Insufficient documentation

## 2017-09-09 HISTORY — DX: Pain in left hand: M79.642

## 2017-10-01 DIAGNOSIS — R972 Elevated prostate specific antigen [PSA]: Secondary | ICD-10-CM | POA: Diagnosis not present

## 2017-10-01 DIAGNOSIS — N401 Enlarged prostate with lower urinary tract symptoms: Secondary | ICD-10-CM | POA: Diagnosis not present

## 2017-10-01 DIAGNOSIS — N138 Other obstructive and reflux uropathy: Secondary | ICD-10-CM | POA: Diagnosis not present

## 2017-10-07 DIAGNOSIS — M6208 Separation of muscle (nontraumatic), other site: Secondary | ICD-10-CM | POA: Diagnosis not present

## 2017-10-07 DIAGNOSIS — Z6827 Body mass index (BMI) 27.0-27.9, adult: Secondary | ICD-10-CM | POA: Diagnosis not present

## 2017-11-05 DIAGNOSIS — Z23 Encounter for immunization: Secondary | ICD-10-CM | POA: Diagnosis not present

## 2017-12-03 DIAGNOSIS — Z6827 Body mass index (BMI) 27.0-27.9, adult: Secondary | ICD-10-CM | POA: Diagnosis not present

## 2017-12-03 DIAGNOSIS — J329 Chronic sinusitis, unspecified: Secondary | ICD-10-CM | POA: Diagnosis not present

## 2017-12-03 DIAGNOSIS — J40 Bronchitis, not specified as acute or chronic: Secondary | ICD-10-CM | POA: Diagnosis not present

## 2017-12-17 DIAGNOSIS — N183 Chronic kidney disease, stage 3 (moderate): Secondary | ICD-10-CM | POA: Diagnosis not present

## 2017-12-17 DIAGNOSIS — J449 Chronic obstructive pulmonary disease, unspecified: Secondary | ICD-10-CM | POA: Diagnosis not present

## 2017-12-17 DIAGNOSIS — K219 Gastro-esophageal reflux disease without esophagitis: Secondary | ICD-10-CM | POA: Diagnosis not present

## 2017-12-17 DIAGNOSIS — E663 Overweight: Secondary | ICD-10-CM | POA: Diagnosis not present

## 2017-12-17 DIAGNOSIS — Z0000 Encounter for general adult medical examination without abnormal findings: Secondary | ICD-10-CM | POA: Diagnosis not present

## 2017-12-17 DIAGNOSIS — N411 Chronic prostatitis: Secondary | ICD-10-CM | POA: Diagnosis not present

## 2017-12-17 DIAGNOSIS — E785 Hyperlipidemia, unspecified: Secondary | ICD-10-CM | POA: Diagnosis not present

## 2017-12-17 DIAGNOSIS — G4733 Obstructive sleep apnea (adult) (pediatric): Secondary | ICD-10-CM | POA: Diagnosis not present

## 2017-12-17 DIAGNOSIS — R972 Elevated prostate specific antigen [PSA]: Secondary | ICD-10-CM | POA: Diagnosis not present

## 2017-12-17 DIAGNOSIS — Z79899 Other long term (current) drug therapy: Secondary | ICD-10-CM | POA: Diagnosis not present

## 2018-01-28 DIAGNOSIS — M5030 Other cervical disc degeneration, unspecified cervical region: Secondary | ICD-10-CM | POA: Diagnosis not present

## 2018-01-28 DIAGNOSIS — M25511 Pain in right shoulder: Secondary | ICD-10-CM | POA: Diagnosis not present

## 2018-01-28 DIAGNOSIS — M542 Cervicalgia: Secondary | ICD-10-CM | POA: Diagnosis not present

## 2018-02-05 DIAGNOSIS — M542 Cervicalgia: Secondary | ICD-10-CM | POA: Diagnosis not present

## 2018-02-11 DIAGNOSIS — M542 Cervicalgia: Secondary | ICD-10-CM | POA: Diagnosis not present

## 2018-04-02 DIAGNOSIS — R69 Illness, unspecified: Secondary | ICD-10-CM | POA: Diagnosis not present

## 2018-05-26 ENCOUNTER — Ambulatory Visit: Payer: Medicare HMO | Admitting: Pulmonary Disease

## 2018-09-10 ENCOUNTER — Other Ambulatory Visit: Payer: Self-pay

## 2018-09-10 ENCOUNTER — Ambulatory Visit (INDEPENDENT_AMBULATORY_CARE_PROVIDER_SITE_OTHER): Payer: Medicare HMO | Admitting: Pulmonary Disease

## 2018-09-10 ENCOUNTER — Encounter: Payer: Self-pay | Admitting: Pulmonary Disease

## 2018-09-10 VITALS — BP 116/60 | HR 58 | Temp 97.9°F | Ht 64.5 in | Wt 169.0 lb

## 2018-09-10 DIAGNOSIS — G4709 Other insomnia: Secondary | ICD-10-CM | POA: Diagnosis not present

## 2018-09-10 DIAGNOSIS — G4733 Obstructive sleep apnea (adult) (pediatric): Secondary | ICD-10-CM

## 2018-09-10 DIAGNOSIS — Z9989 Dependence on other enabling machines and devices: Secondary | ICD-10-CM | POA: Diagnosis not present

## 2018-09-10 MED ORDER — ZOLPIDEM TARTRATE 1.75 MG SL SUBL: 1.7500 mg | SUBLINGUAL_TABLET | SUBLINGUAL | Status: DC | PRN

## 2018-09-10 MED ORDER — ZOLPIDEM TARTRATE 1.75 MG SL SUBL: 1.7500 mg | SUBLINGUAL_TABLET | SUBLINGUAL | 5 refills | Status: DC | PRN

## 2018-09-10 NOTE — Patient Instructions (Signed)
Zolpidem 1.75 mg sublingual tablet as needed when you are having trouble falling back to sleep   Follow up in 1 year

## 2018-09-10 NOTE — Progress Notes (Signed)
[name]  Chief Complaint  Patient presents with  . Follow-up    cpap    Constitutional:  BP 116/60 (BP Location: Right Arm, Cuff Size: Normal)   Pulse (!) 58   Temp 97.9 F (36.6 C) (Oral)   Ht 5' 4.5" (1.638 m)   Wt 169 lb (76.7 kg)   SpO2 98%   BMI 28.56 kg/m   Past Medical History:  Varicose veins, GERD, Neuropathy, HLD, BPH  Brief Summary:  [name] [name] is a 78 y.o. male with obstructive sleep apnea.  He has been doing better with CPAP since pressure was dropped to 14.  He uses full face mask.  Denies sinus congestion, sore throat, dry mouth, or aerophagia.  Not having mask leak.  He feels he can fall asleep okay.  He has trouble staying asleep.  He has tried melatonin, but didn't work.  Has been using 1/2 pill of benadryl.  This helps him fall back to sleep, but then he has hangover effect next morning.   Physical Exam:   Appearance - well kempt   ENMT - clear nasal mucosa, midline nasal  septum, no oral exudates, no LAN, trachea midline  Respiratory - normal chest wall, normal respiratory effort, no accessory muscle use, no wheeze/rales  CV - s1s2 regular rate and rhythm, no murmurs, no peripheral edema, radial pulses symmetric  GI - soft, non tender, no masses  Lymph - no adenopathy noted in neck and axillary areas  MSK - normal gait  Ext - no cyanosis, clubbing, or joint inflammation noted  Skin - no rashes, lesions, or ulcers  Neuro - normal strength, oriented x 3  Psych - normal mood and affect   Assessment/Plan:   Obstructive sleep apnea - he is compliant with CPAP and reports benefit from therapy - continue CPAP 14 cm H2O - discussed options to improve mask fit  Sleep maintenance insomnia. - will have him try zolpidem 1.75 mg sublingual prn   Patient Instructions  Zolpidem 1.75 mg sublingual tablet as needed when you are having trouble falling back to sleep   Follow up in 1 year     [name], MD [name]: [phone] 09/10/2018, 12:39 PM  Flow Sheet    Sleep tests:  PSG 04/21/03 >> AHI 54 CPAP7/13/20 to 09/09/18 >> used on 30 of 30 nights with average 7 hrs 42 min.  Average AHI 7.3 with CPAP 14 cm H2O  Medications:   Allergies as of 09/10/2018      Reactions   Penicillins    Does not work for pt      Medication List       Accurate as of September 10, 2018 12:39 PM. If you have any questions, ask your nurse or doctor.        ALOE VERA PO Take 2 tablets by mouth 2 (two) times daily.   fluticasone 50 MCG/ACT nasal spray Commonly known as: FLONASE Place 2 sprays into both nostrils daily. What changed:   when to take this  reasons to take this   hydrocortisone 2.5 % rectal cream Commonly known as: ANUSOL-HC Place 1 application rectally 2 (two) times daily.   omeprazole 20 MG capsule Commonly known as: PRILOSEC Take by mouth.   phenylephrine-shark liver oil-mineral oil-petrolatum 0.25-3-14-71.9 % rectal ointment Commonly known as: PREPARATION H Place 1 application rectally 2 (two) times daily as needed.   tamsulosin 0.4 MG Caps capsule Commonly known as: FLOMAX Take 0.4  mg by mouth daily.   Zolpidem Tartrate 1.75 MG Subl Place 1.75 mg under the tongue as needed (help falling back to sleep). Started by: [name], MD       Past Surgical History:  He  has a past surgical history that includes Appendectomy (1610(1957); Spine surgery; Nasal sinus surgery; and Colonoscopy (02/10/2013).  Family History:  His family history includes Allergies in his brother, father, mother, and sister; Asthma in his brother; Cancer in his brother and sister; Heart disease in his brother, mother, and sister.  Social History:  He  reports that he quit smoking about 54 years ago. His smoking use included cigarettes. He started smoking about 66 years ago. He has a 12.00 pack-year smoking history. He has never used smokeless tobacco. He  reports that he does not drink alcohol or use drugs.

## 2018-10-16 ENCOUNTER — Telehealth: Payer: Self-pay | Admitting: Gastroenterology

## 2018-10-16 NOTE — Telephone Encounter (Signed)
[name] [name], can you please find or request this patient's records (see previous note) and place on Dr.Gupta's desk for review Thank you

## 2018-10-17 NOTE — Telephone Encounter (Signed)
I have sent request to Dr. Sherren Kerns office.

## 2018-10-22 DIAGNOSIS — Z8042 Family history of malignant neoplasm of prostate: Secondary | ICD-10-CM

## 2018-10-22 DIAGNOSIS — N138 Other obstructive and reflux uropathy: Secondary | ICD-10-CM | POA: Diagnosis not present

## 2018-10-22 DIAGNOSIS — G4733 Obstructive sleep apnea (adult) (pediatric): Secondary | ICD-10-CM | POA: Diagnosis not present

## 2018-10-22 DIAGNOSIS — N401 Enlarged prostate with lower urinary tract symptoms: Secondary | ICD-10-CM | POA: Diagnosis not present

## 2018-10-22 DIAGNOSIS — R972 Elevated prostate specific antigen [PSA]: Secondary | ICD-10-CM | POA: Diagnosis not present

## 2018-10-22 HISTORY — DX: Family history of malignant neoplasm of prostate: Z80.42

## 2018-11-03 NOTE — Telephone Encounter (Signed)
Pt called and requested an update.  Pt stated that his daughter had polyps and her PCP requested family history.

## 2018-11-08 DIAGNOSIS — Z23 Encounter for immunization: Secondary | ICD-10-CM | POA: Diagnosis not present

## 2018-11-12 NOTE — Telephone Encounter (Signed)
I have put this on your desk for review.  

## 2018-11-12 NOTE — Telephone Encounter (Signed)
I have reviewed the colonoscopy report from 04/2010-from Dr. Alinda Sierras was good, 2 small polyps measuring 2 mm s/p polypectomy, Bx- TAs.  He originally advised to repeat in 5 years  Now that he is 78-we do stop doing colonoscopies at age 78.  But, I do believe he is very healthy.  If he wants to get it done, we will be more than happy to.  But, we should see him in the clinic beforehand and discuss   RG

## 2018-11-13 NOTE — Telephone Encounter (Signed)
I have spoke with this patient and he said he would think about if he wanted to have another colonoscopy and give [place] a call back.

## 2018-11-21 DIAGNOSIS — G4733 Obstructive sleep apnea (adult) (pediatric): Secondary | ICD-10-CM | POA: Diagnosis not present

## 2018-12-01 DIAGNOSIS — J309 Allergic rhinitis, unspecified: Secondary | ICD-10-CM | POA: Diagnosis not present

## 2018-12-01 DIAGNOSIS — Z809 Family history of malignant neoplasm, unspecified: Secondary | ICD-10-CM | POA: Diagnosis not present

## 2018-12-01 DIAGNOSIS — Z79899 Other long term (current) drug therapy: Secondary | ICD-10-CM | POA: Diagnosis not present

## 2018-12-01 DIAGNOSIS — N529 Male erectile dysfunction, unspecified: Secondary | ICD-10-CM | POA: Diagnosis not present

## 2018-12-01 DIAGNOSIS — N400 Enlarged prostate without lower urinary tract symptoms: Secondary | ICD-10-CM | POA: Diagnosis not present

## 2018-12-01 DIAGNOSIS — K219 Gastro-esophageal reflux disease without esophagitis: Secondary | ICD-10-CM | POA: Diagnosis not present

## 2018-12-01 DIAGNOSIS — Z823 Family history of stroke: Secondary | ICD-10-CM | POA: Diagnosis not present

## 2018-12-01 DIAGNOSIS — Z8249 Family history of ischemic heart disease and other diseases of the circulatory system: Secondary | ICD-10-CM | POA: Diagnosis not present

## 2018-12-01 DIAGNOSIS — G8929 Other chronic pain: Secondary | ICD-10-CM | POA: Diagnosis not present

## 2018-12-01 DIAGNOSIS — Z87891 Personal history of nicotine dependence: Secondary | ICD-10-CM | POA: Diagnosis not present

## 2018-12-15 DIAGNOSIS — Z79899 Other long term (current) drug therapy: Secondary | ICD-10-CM | POA: Diagnosis not present

## 2018-12-15 DIAGNOSIS — E785 Hyperlipidemia, unspecified: Secondary | ICD-10-CM | POA: Diagnosis not present

## 2018-12-22 DIAGNOSIS — Z9181 History of falling: Secondary | ICD-10-CM | POA: Diagnosis not present

## 2018-12-22 DIAGNOSIS — J449 Chronic obstructive pulmonary disease, unspecified: Secondary | ICD-10-CM | POA: Diagnosis not present

## 2018-12-22 DIAGNOSIS — N183 Chronic kidney disease, stage 3 unspecified: Secondary | ICD-10-CM | POA: Diagnosis not present

## 2018-12-22 DIAGNOSIS — G4733 Obstructive sleep apnea (adult) (pediatric): Secondary | ICD-10-CM | POA: Diagnosis not present

## 2018-12-22 DIAGNOSIS — N411 Chronic prostatitis: Secondary | ICD-10-CM | POA: Diagnosis not present

## 2018-12-22 DIAGNOSIS — E663 Overweight: Secondary | ICD-10-CM | POA: Diagnosis not present

## 2018-12-22 DIAGNOSIS — E785 Hyperlipidemia, unspecified: Secondary | ICD-10-CM | POA: Diagnosis not present

## 2018-12-22 DIAGNOSIS — K219 Gastro-esophageal reflux disease without esophagitis: Secondary | ICD-10-CM | POA: Diagnosis not present

## 2018-12-22 DIAGNOSIS — Z0000 Encounter for general adult medical examination without abnormal findings: Secondary | ICD-10-CM | POA: Diagnosis not present

## 2018-12-22 DIAGNOSIS — Z6827 Body mass index (BMI) 27.0-27.9, adult: Secondary | ICD-10-CM | POA: Diagnosis not present

## 2019-04-06 DIAGNOSIS — R69 Illness, unspecified: Secondary | ICD-10-CM | POA: Diagnosis not present

## 2019-05-20 DIAGNOSIS — N183 Chronic kidney disease, stage 3 unspecified: Secondary | ICD-10-CM | POA: Diagnosis not present

## 2019-05-20 DIAGNOSIS — Z6827 Body mass index (BMI) 27.0-27.9, adult: Secondary | ICD-10-CM | POA: Diagnosis not present

## 2019-05-20 DIAGNOSIS — J449 Chronic obstructive pulmonary disease, unspecified: Secondary | ICD-10-CM | POA: Diagnosis not present

## 2019-05-20 DIAGNOSIS — S60512A Abrasion of left hand, initial encounter: Secondary | ICD-10-CM | POA: Diagnosis not present

## 2019-05-20 DIAGNOSIS — E663 Overweight: Secondary | ICD-10-CM | POA: Diagnosis not present

## 2019-05-20 DIAGNOSIS — G4733 Obstructive sleep apnea (adult) (pediatric): Secondary | ICD-10-CM | POA: Diagnosis not present

## 2019-06-02 DIAGNOSIS — M5440 Lumbago with sciatica, unspecified side: Secondary | ICD-10-CM | POA: Diagnosis not present

## 2019-06-12 DIAGNOSIS — M48061 Spinal stenosis, lumbar region without neurogenic claudication: Secondary | ICD-10-CM | POA: Diagnosis not present

## 2019-06-19 DIAGNOSIS — R030 Elevated blood-pressure reading, without diagnosis of hypertension: Secondary | ICD-10-CM | POA: Insufficient documentation

## 2019-06-19 DIAGNOSIS — M4807 Spinal stenosis, lumbosacral region: Secondary | ICD-10-CM | POA: Diagnosis not present

## 2019-06-19 DIAGNOSIS — M5440 Lumbago with sciatica, unspecified side: Secondary | ICD-10-CM | POA: Diagnosis not present

## 2019-06-19 HISTORY — DX: Elevated blood-pressure reading, without diagnosis of hypertension: R03.0

## 2019-07-10 DIAGNOSIS — R519 Headache, unspecified: Secondary | ICD-10-CM | POA: Diagnosis not present

## 2019-07-10 DIAGNOSIS — M48062 Spinal stenosis, lumbar region with neurogenic claudication: Secondary | ICD-10-CM | POA: Diagnosis not present

## 2019-07-10 DIAGNOSIS — R030 Elevated blood-pressure reading, without diagnosis of hypertension: Secondary | ICD-10-CM | POA: Diagnosis not present

## 2019-07-10 DIAGNOSIS — Z6829 Body mass index (BMI) 29.0-29.9, adult: Secondary | ICD-10-CM | POA: Diagnosis not present

## 2019-07-10 DIAGNOSIS — Z6827 Body mass index (BMI) 27.0-27.9, adult: Secondary | ICD-10-CM | POA: Diagnosis not present

## 2019-07-10 DIAGNOSIS — R002 Palpitations: Secondary | ICD-10-CM | POA: Diagnosis not present

## 2019-07-29 ENCOUNTER — Other Ambulatory Visit: Payer: Self-pay

## 2019-07-29 DIAGNOSIS — R079 Chest pain, unspecified: Secondary | ICD-10-CM | POA: Insufficient documentation

## 2019-07-29 DIAGNOSIS — E785 Hyperlipidemia, unspecified: Secondary | ICD-10-CM

## 2019-07-29 DIAGNOSIS — I8390 Asymptomatic varicose veins of unspecified lower extremity: Secondary | ICD-10-CM

## 2019-07-29 DIAGNOSIS — M5126 Other intervertebral disc displacement, lumbar region: Secondary | ICD-10-CM

## 2019-07-29 DIAGNOSIS — H905 Unspecified sensorineural hearing loss: Secondary | ICD-10-CM | POA: Insufficient documentation

## 2019-07-29 DIAGNOSIS — M545 Low back pain, unspecified: Secondary | ICD-10-CM

## 2019-07-29 DIAGNOSIS — R0602 Shortness of breath: Secondary | ICD-10-CM

## 2019-07-29 DIAGNOSIS — J343 Hypertrophy of nasal turbinates: Secondary | ICD-10-CM

## 2019-07-29 DIAGNOSIS — J329 Chronic sinusitis, unspecified: Secondary | ICD-10-CM | POA: Insufficient documentation

## 2019-07-29 DIAGNOSIS — M5416 Radiculopathy, lumbar region: Secondary | ICD-10-CM

## 2019-07-29 DIAGNOSIS — N400 Enlarged prostate without lower urinary tract symptoms: Secondary | ICD-10-CM | POA: Insufficient documentation

## 2019-07-29 DIAGNOSIS — K2100 Gastro-esophageal reflux disease with esophagitis, without bleeding: Secondary | ICD-10-CM

## 2019-07-29 DIAGNOSIS — Z136 Encounter for screening for cardiovascular disorders: Secondary | ICD-10-CM

## 2019-07-29 DIAGNOSIS — Z23 Encounter for immunization: Secondary | ICD-10-CM

## 2019-07-29 DIAGNOSIS — Z79899 Other long term (current) drug therapy: Secondary | ICD-10-CM

## 2019-07-29 DIAGNOSIS — G629 Polyneuropathy, unspecified: Secondary | ICD-10-CM

## 2019-07-29 DIAGNOSIS — D509 Iron deficiency anemia, unspecified: Secondary | ICD-10-CM | POA: Insufficient documentation

## 2019-07-29 HISTORY — DX: Encounter for immunization: Z23

## 2019-07-29 HISTORY — DX: Low back pain, unspecified: M54.50

## 2019-07-29 HISTORY — DX: Unspecified sensorineural hearing loss: H90.5

## 2019-07-29 HISTORY — DX: Polyneuropathy, unspecified: G62.9

## 2019-07-29 HISTORY — DX: Radiculopathy, lumbar region: M54.16

## 2019-07-29 HISTORY — DX: Other intervertebral disc displacement, lumbar region: M51.26

## 2019-07-29 HISTORY — DX: Hyperlipidemia, unspecified: E78.5

## 2019-07-29 HISTORY — DX: Gastro-esophageal reflux disease with esophagitis, without bleeding: K21.00

## 2019-07-29 HISTORY — DX: Encounter for screening for cardiovascular disorders: Z13.6

## 2019-07-29 HISTORY — DX: Other long term (current) drug therapy: Z79.899

## 2019-07-29 HISTORY — DX: Hypertrophy of nasal turbinates: J34.3

## 2019-07-29 HISTORY — DX: Chronic sinusitis, unspecified: J32.9

## 2019-07-29 HISTORY — DX: Shortness of breath: R06.02

## 2019-07-29 HISTORY — DX: Asymptomatic varicose veins of unspecified lower extremity: I83.90

## 2019-07-30 ENCOUNTER — Encounter: Payer: Self-pay | Admitting: Cardiology

## 2019-07-30 ENCOUNTER — Ambulatory Visit: Payer: Medicare HMO | Admitting: Cardiology

## 2019-07-30 ENCOUNTER — Other Ambulatory Visit: Payer: Self-pay

## 2019-07-30 ENCOUNTER — Ambulatory Visit (INDEPENDENT_AMBULATORY_CARE_PROVIDER_SITE_OTHER): Payer: Medicare HMO

## 2019-07-30 DIAGNOSIS — R0789 Other chest pain: Secondary | ICD-10-CM

## 2019-07-30 DIAGNOSIS — E785 Hyperlipidemia, unspecified: Secondary | ICD-10-CM

## 2019-07-30 DIAGNOSIS — R002 Palpitations: Secondary | ICD-10-CM

## 2019-07-30 HISTORY — DX: Palpitations: R00.2

## 2019-07-30 NOTE — Progress Notes (Signed)
Cardiology Office Note:    Date:  07/30/2019   ID:  [name] [name], DOB [birth year], MRN [id number]  PCP:  Street, [name], MD  Cardiologist:  Thomasene Ripple, DO  Electrophysiologist:  None   Referring MD: Noni Saupe, MD   Chief Complaint  Patient presents with  . New Patient (Initial Visit)  " My heart has been pounding at night."  History of Present Illness:    [name] [name] is a 79 y.o. male with a hx of BPH, COPD, dyslipidemia presents today to be evaluated for palpitations or chest pain.  Patient tells me that over the last several months he has been experiencing progressive palpitations.  He notes that is only mostly at night when he lies down.  He states that whenever he moves at night he feels significant increase in first heartbeat which lasts for 2 to 3 minutes prior to resolution.  He states he has 2-3 episodes at night which always wakes him up.  He tells me that he has been recording these episodes since June 14 in between June 14 to June 30 has 60 episodes.  In addition he has had intermittent chest pain.  He describes his pain as a dull intermittent pain which radiates from his epigastric area up to his mid sternum area- no other complaints at this time.  Is concerned because he has brothers who have had heart attacks leading to their death.  He is here today with his wife.  Past Medical History:  Diagnosis Date  . Acute prostatitis 12/19/2015  . Acute respiratory infection   . Allergic rhinitis, seasonal   . Apnea, sleep 09/18/2007   NPSG 2005:  AHI 54/hr New machine 2014 Auto 2014:  Optimal pressure 20cm, but still with some leak and breakthru events.  Download 2015:  Pressure 18, no significant leaks, AHI 12/hr.   . 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 Benign prostatic hyperplasia with lower urinary tract symptoms 12/19/2015  . Breath shortness 07/29/2019  . Central hearing loss   . Chest pain   . Chronic infection of sinus 07/29/2019  . Chronic kidney  disease, stage 3   . Chronic obstructive pulmonary disease (COPD) (HCC)   . Chronic prostatitis   . Chronic sinusitis   . 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 DOE (dyspnea on exertion) 07/22/2012  . Dyslipidemia   . Dysuria   . Elevated PSA   . Elevated PSA, less than 10 ng/ml   . Encounter for screening for cardiovascular disorders 07/29/2019  . Enlarged prostate   . Esophageal reflux   . Esophagitis, reflux 07/29/2019  . Family history of prostate cancer 10/22/2018  . Flu vaccine need 07/29/2019  . GERD (gastroesophageal reflux disease)   . Hemorrhoids   . Herniated nucleus pulposus, L3-4 left   . HLD (hyperlipidemia) 07/29/2019  . Hyperlipidemia   . Hypertrophy of nasal turbinates 07/29/2019  . Hypochromic microcytic anemia   . Internal and external prolapsed hemorrhoids   . LBP (low back pain) 07/29/2019  . 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 Lower back pain   . Lumbar neuritis   . Nasal turbinate hypertrophy   . Obstructive sleep apnea 09/18/2007   NPSG 2005:  AHI 54/hr New machine 2014 Auto 2014:  Optimal pressure 20cm, but still with some leak and breakthru events.  Download 2015:  Pressure 18, no significant leaks, AHI 12/hr.    . OSA (obstructive sleep apnea)    severe  . Other  long term (current) drug therapy 07/29/2019  . Overweight   . Pain of left hand 09/09/2017  . 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 Peripheral neuropathy   . Radiculopathy of lumbar region 07/29/2019  . Reflux esophagitis   . Seasonal allergic rhinitis   . Shortness of breath   . Sleep apnea   . 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 APPENDECTOMY  1957  . COLONOSCOPY  02/10/2013   Tubular Adenoma  . NASAL SINUS SURGERY    . SPINE SURGERY      Current Medications: Current Meds  Medication Sig  . Calcium Polycarbophil (KONSYL FIBER PO) Take 1 Dose by mouth.  . fluticasone (FLONASE)  50 MCG/ACT nasal spray Place into both nostrils as needed for allergies or rhinitis.  Marland Kitchen omeprazole (PRILOSEC) 20 MG capsule Take by mouth.  . tamsulosin (FLOMAX) 0.4 MG CAPS capsule Take 0.4 mg by mouth daily.  Marland Kitchen VITAMIN D, CHOLECALCIFEROL, PO Take by mouth.     Allergies:   Penicillins   Social History   Socioeconomic History  . Marital status: Married    Spouse name: Not on file  . Number of children: Not on file  . Years of education: Not on file  . Highest education level: Not on file  Occupational History  . Occupation: retired  Tobacco Use  . Smoking status: Former Smoker    Packs/day: 1.00    Years: 12.00    Pack years: 12.00    Types: Cigarettes    Start date: 16    Quit date: 01/30/1964    Years since quitting: 55.5  . Smokeless tobacco: Never Used  Substance and Sexual Activity  . Alcohol use: No  . Drug use: No  . Sexual activity: Not on file  Other Topics Concern  . Not on file  Social History Narrative  . Not on file   Social Determinants of Health   Financial Resource Strain:   . Difficulty of Paying Living Expenses:   Food Insecurity:   . Worried About Programme researcher, broadcasting/film/video in the Last Year:   . Barista in the Last Year:   Transportation Needs:   . Freight forwarder (Medical):   Marland Kitchen Lack of Transportation (Non-Medical):   Physical Activity:   . Days of Exercise per Week:   . Minutes of Exercise per Session:   Stress:   . Feeling of Stress :   Social Connections:   . Frequency of Communication with Friends and Family:   . Frequency of Social Gatherings with Friends and Family:   . Attends Religious Services:   . Active Member of Clubs or Organizations:   . Attends Banker Meetings:   Marland Kitchen Marital Status:      Family History: The patient's family history includes Allergies in his brother, father, mother, and sister; Asthma in his brother; Cancer in his brother and sister; Heart disease in his brother, mother, and  sister.  ROS:   Review of Systems  Constitution: Negative for decreased appetite, fever and weight gain.  HENT: Negative for congestion, ear discharge, hoarse voice and sore throat.   Eyes: Negative for discharge, redness, vision loss in right eye and visual halos.  Cardiovascular: Negative for chest pain, dyspnea on exertion, leg swelling, orthopnea and palpitations.  Respiratory: Negative for cough, hemoptysis, shortness of breath and snoring.   Endocrine: Negative for heat intolerance and polyphagia.  Hematologic/Lymphatic: Negative for bleeding problem. Does not bruise/bleed easily.  Skin: Negative for flushing, nail changes, rash and suspicious lesions.  Musculoskeletal: Negative for arthritis, joint pain, muscle cramps, myalgias, neck pain and stiffness.  Gastrointestinal: Negative for abdominal pain, bowel incontinence, diarrhea and excessive appetite.  Genitourinary: Negative for decreased libido, genital sores and incomplete emptying.  Neurological: Negative for brief paralysis, focal weakness, headaches and loss of balance.  Psychiatric/Behavioral: Negative for altered mental status, depression and suicidal ideas.  Allergic/Immunologic: Negative for HIV exposure and persistent infections.    EKGs/Labs/Other Studies Reviewed:    The following studies were reviewed today:   EKG:  The ekg ordered today demonstrates   Recent Labs: No results found for requested labs within last 8760 hours.  Recent Lipid Panel No results found for: CHOL, TRIG, HDL, CHOLHDL, VLDL, LDLCALC, LDLDIRECT  Physical Exam:    VS:  BP 140/70   Pulse (!) 56   Ht 5' 4.5" (1.638 m)   Wt 162 lb (73.5 kg)   SpO2 95%   BMI 27.38 kg/m     Wt Readings from Last 3 Encounters:  07/30/19 162 lb (73.5 kg)  09/10/18 169 lb (76.7 kg)  06/04/17 172 lb 8 oz (78.2 kg)     GEN: Well nourished, well developed in no acute distress HEENT: Normal NECK: No JVD; No carotid bruits LYMPHATICS: No  lymphadenopathy CARDIAC: S1S2 noted,RRR, no murmurs, rubs, gallops RESPIRATORY:  Clear to auscultation without rales, wheezing or rhonchi  ABDOMEN: Soft, non-tender, non-distended, +bowel sounds, no guarding. EXTREMITIES: No edema, No cyanosis, no clubbing MUSCULOSKELETAL:  No deformity  SKIN: Warm and dry NEUROLOGIC:  Alert and oriented x 3, non-focal PSYCHIATRIC:  Normal affect, good insight  ASSESSMENT:    1. Palpitations   2. Other chest pain   3. Dyslipidemia    PLAN:     1.  I would like to rule out a cardiovascular etiology of this palpitation, therefore at this time I would like to placed a zio patch for  7 days. In additon a transthoracic echocardiogram will be ordered to assess LV/RV function and any structural abnormalities. Once these testing have been performed amd reviewed further reccomendations will be made. For now, I do reccomend that the patient goes to the nearest ED if  symptoms recur.  2.  In terms of his chest pain he does have risk factors therefore it will be appropriate to proceed with nuclear stress test in this patient to rule out any coronary artery disease.  For his LDL is 131, total cholesterol 185, triglyceride 98, HDL 34 which was in November 2020 the patient reports that he has had blood work since that time.  3.  Dyslipidemia-we will get records of his blood work from his PCP office.  Currently not on any statin medication.  4.  Sleep apnea-continue CPAP change.  The patient is in agreement with the above plan. The patient left the office in stable condition.  The patient will follow up in 2 months or sooner if needed.   Medication Adjustments/Labs and Tests Ordered: Current medicines are reviewed at length with the patient today.  Concerns regarding medicines are outlined above.  No orders of the defined types were placed in this encounter.  No orders of the defined types were placed in this encounter.   Patient Instructions  Medication  Instructions:  Your physician recommends that you continue on your current medications as directed. Please refer to the Current Medication list given to you today.   *  If you need a refill on your cardiac medications before your next appointment, please call your pharmacy*   Lab Work: Tsh- Today   If you have labs (blood work) drawn today and your tests are completely normal, you will receive your results only by: Marland Kitchen MyChart Message (if you have MyChart) OR . A paper copy in the mail If you have any lab test that is abnormal or we need to change your treatment, we will call you to review the results.   Testing/Procedures: Your physician has requested that you have an echocardiogram. Echocardiography is a painless test that uses sound waves to create images of your heart. It provides your doctor with information about the size and shape of your heart and how well your heart's chambers and valves are working. This procedure takes approximately one hour. There are no restrictions for this procedure.  Your physician has requested that you have a lexiscan myoview. For further information please visit [email or web address] Please follow instruction sheet, as given.  A zio monitor was ordered today. It will remain on for 7 days. You will then return monitor and event diary in provided box. It takes 1-2 weeks for report to be downloaded and returned to [place]. We will call you with the results. If monitor falls off or has orange flashing light, please call Zio for further instructions.   Follow-Up: At [facility], you and your health needs are our priority.  As part of our continuing mission to provide you with exceptional heart care, we have created designated Provider Care Teams.  These Care Teams include your primary Cardiologist (physician) and Advanced Practice Providers (APPs -  Physician Assistants and Nurse Practitioners) who all work together to provide you with the care you need, when you need  it.  We recommend signing up for the patient portal called "MyChart".  Sign up information is provided on this After Visit Summary.  MyChart is used to connect with patients for Virtual Visits (Telemedicine).  Patients are able to view lab/test results, encounter notes, upcoming appointments, etc.  Non-urgent messages can be sent to your provider as well.   To learn more about what you can do with MyChart, go to [email or web address].    Your next appointment:   2 month(s)  The format for your next appointment:   In Person  Provider:   Thomasene Ripple, DO   Other Instructions None      Adopting a Healthy Lifestyle.  Know what a healthy weight is for you (roughly BMI <25) and aim to maintain this   Aim for 7+ servings of fruits and vegetables daily   65-80+ fluid ounces of water or unsweet tea for healthy kidneys   Limit to max 1 drink of alcohol per day; avoid smoking/tobacco   Limit animal fats in diet for cholesterol and heart health - choose grass fed whenever available   Avoid highly processed foods, and foods high in saturated/trans fats   Aim for low stress - take time to unwind and care for your mental health   Aim for 150 min of moderate intensity exercise weekly for heart health, and weights twice weekly for bone health   Aim for 7-9 hours of sleep daily   When it comes to diets, agreement about the perfect plan isnt easy to find, even among the experts. Experts at the [facility] [employer] developed an idea known as the Healthy Eating Plate. Just imagine a plate divided into logical, healthy portions.   The  emphasis is on diet quality:   Load up on vegetables and fruits - one-half of your plate: Aim for color and variety, and remember that potatoes dont count.   Go for whole grains - one-quarter of your plate: Whole wheat, barley, wheat berries, quinoa, oats, brown rice, and foods made with them. If you want pasta, go with whole wheat pasta.    Protein power - one-quarter of your plate: Fish, chicken, beans, and nuts are all healthy, versatile protein sources. Limit red meat.   The diet, however, does go beyond the plate, offering a few other suggestions.   Use healthy plant oils, such as olive, canola, soy, corn, sunflower and peanut. Check the labels, and avoid partially hydrogenated oil, which have unhealthy trans fats.   If youre thirsty, drink water. Coffee and tea are good in moderation, but skip sugary drinks and limit milk and dairy products to one or two daily servings.   The type of carbohydrate in the diet is more important than the amount. Some sources of carbohydrates, such as vegetables, fruits, whole grains, and beans-are healthier than others.   Finally, stay active  Signed, Thomasene Ripple, DO  07/30/2019 2:41 PM    [facility]

## 2019-07-30 NOTE — Patient Instructions (Signed)
Medication Instructions:  Your physician recommends that you continue on your current medications as directed. Please refer to the Current Medication list given to you today.   *If you need a refill on your cardiac medications before your next appointment, please call your pharmacy*   Lab Work: Tsh- Today   If you have labs (blood work) drawn today and your tests are completely normal, you will receive your results only by: Marland Kitchen MyChart Message (if you have MyChart) OR . A paper copy in the mail If you have any lab test that is abnormal or we need to change your treatment, we will call you to review the results.   Testing/Procedures: Your physician has requested that you have an echocardiogram. Echocardiography is a painless test that uses sound waves to create images of your heart. It provides your doctor with information about the size and shape of your heart and how well your heart's chambers and valves are working. This procedure takes approximately one hour. There are no restrictions for this procedure.  Your physician has requested that you have a lexiscan myoview. For further information please visit [email or web address] Please follow instruction sheet, as given.  A zio monitor was ordered today. It will remain on for 7 days. You will then return monitor and event diary in provided box. It takes 1-2 weeks for report to be downloaded and returned to [place]. We will call you with the results. If monitor falls off or has orange flashing light, please call Zio for further instructions.   Follow-Up: At [facility], you and your health needs are our priority.  As part of our continuing mission to provide you with exceptional heart care, we have created designated Provider Care Teams.  These Care Teams include your primary Cardiologist (physician) and Advanced Practice Providers (APPs -  Physician Assistants and Nurse Practitioners) who all work together to provide you with the care you need, when  you need it.  We recommend signing up for the patient portal called "MyChart".  Sign up information is provided on this After Visit Summary.  MyChart is used to connect with patients for Virtual Visits (Telemedicine).  Patients are able to view lab/test results, encounter notes, upcoming appointments, etc.  Non-urgent messages can be sent to your provider as well.   To learn more about what you can do with MyChart, go to [email or web address].    Your next appointment:   2 month(s)  The format for your next appointment:   In Person  Provider:   Thomasene Ripple, DO   Other Instructions None

## 2019-07-31 ENCOUNTER — Telehealth: Payer: Self-pay

## 2019-07-31 LAB — TSH: TSH: 3.42 u[IU]/mL (ref 0.450–4.500)

## 2019-07-31 NOTE — Telephone Encounter (Signed)
Spoke with patient regarding results and recommendation.  Patient verbalizes understanding and is agreeable to plan of care. Advised patient to call back with any issues or concerns.  

## 2019-07-31 NOTE — Telephone Encounter (Signed)
-----   Message from Thomasene Ripple, DO sent at 07/31/2019  8:15 AM EDT ----- TSH normal

## 2019-08-12 ENCOUNTER — Telehealth (HOSPITAL_COMMUNITY): Payer: Self-pay | Admitting: *Deleted

## 2019-08-12 DIAGNOSIS — M48062 Spinal stenosis, lumbar region with neurogenic claudication: Secondary | ICD-10-CM | POA: Diagnosis not present

## 2019-08-12 NOTE — Telephone Encounter (Signed)
Patient given detailed instructions per Myocardial Perfusion Study Information Sheet for the test on 08/18/19. Patient notified to arrive 15 minutes early and that it is imperative to arrive on time for appointment to keep from having the test rescheduled.  If you need to cancel or reschedule your appointment, please call the office within 24 hours of your appointment. . Patient verbalized understanding.Ricky Ala

## 2019-08-18 ENCOUNTER — Ambulatory Visit (INDEPENDENT_AMBULATORY_CARE_PROVIDER_SITE_OTHER): Payer: Medicare HMO

## 2019-08-18 ENCOUNTER — Other Ambulatory Visit: Payer: Self-pay

## 2019-08-18 DIAGNOSIS — R002 Palpitations: Secondary | ICD-10-CM

## 2019-08-18 DIAGNOSIS — R0789 Other chest pain: Secondary | ICD-10-CM

## 2019-08-18 LAB — MYOCARDIAL PERFUSION IMAGING
LV dias vol: 86 mL (ref 62–150)
LV sys vol: 30 mL
Peak HR: 93 {beats}/min
Rest HR: 49 {beats}/min
SDS: 3
SRS: 5
SSS: 8
TID: 0.99

## 2019-08-18 MED ORDER — TECHNETIUM TC 99M TETROFOSMIN IV KIT
30.9000 | PACK | Freq: Once | INTRAVENOUS | Status: AC | PRN
  Administered 2019-08-18: 30.9 via INTRAVENOUS

## 2019-08-18 MED ORDER — REGADENOSON 0.4 MG/5ML IV SOLN
0.4000 mg | Freq: Once | INTRAVENOUS | Status: AC
  Administered 2019-08-18: 0.4 mg via INTRAVENOUS

## 2019-08-18 MED ORDER — TECHNETIUM TC 99M TETROFOSMIN IV KIT
10.1000 | PACK | Freq: Once | INTRAVENOUS | Status: AC | PRN
  Administered 2019-08-18: 10.1 via INTRAVENOUS

## 2019-08-19 ENCOUNTER — Telehealth: Payer: Self-pay

## 2019-08-19 LAB — ECHOCARDIOGRAM COMPLETE
Area-P 1/2: 3.08 cm2
Height: 64 in
S' Lateral: 3.3 cm
Weight: 2592 oz

## 2019-08-19 NOTE — Telephone Encounter (Signed)
-----   Message from Leanord Hawking, RN sent at 08/19/2019  8:04 AM EDT ----- Please schedule pt with Dr. Servando Salina in the next week to follow up about his recent tests. Thanks  ----- Message ----- From: Thomasene Ripple, DO Sent: 08/19/2019   7:55 AM EDT To: Mickie Bail Ash/Hp Triage  Please have Mr. Aderhold see me in the next week.

## 2019-08-19 NOTE — Telephone Encounter (Signed)
Spoke to patient just now and got him scheduled to see Dr. Servando Salina tomorrow to follow up on these results. He verbalizes understanding and thanks me for the call back.    Encouraged patient to call back with any questions or concerns.

## 2019-08-20 ENCOUNTER — Other Ambulatory Visit: Payer: Self-pay

## 2019-08-20 ENCOUNTER — Encounter: Payer: Self-pay | Admitting: Cardiology

## 2019-08-20 ENCOUNTER — Ambulatory Visit: Payer: Medicare HMO | Admitting: Cardiology

## 2019-08-20 VITALS — BP 126/72 | HR 60 | Ht 64.0 in | Wt 171.4 lb

## 2019-08-20 DIAGNOSIS — R002 Palpitations: Secondary | ICD-10-CM

## 2019-08-20 DIAGNOSIS — E782 Mixed hyperlipidemia: Secondary | ICD-10-CM | POA: Diagnosis not present

## 2019-08-20 DIAGNOSIS — R072 Precordial pain: Secondary | ICD-10-CM | POA: Diagnosis not present

## 2019-08-20 DIAGNOSIS — R9439 Abnormal result of other cardiovascular function study: Secondary | ICD-10-CM

## 2019-08-20 MED ORDER — PANTOPRAZOLE SODIUM 40 MG PO TBEC
40.0000 mg | DELAYED_RELEASE_TABLET | Freq: Every day | ORAL | 3 refills | Status: DC
Start: 2019-08-20 — End: 2019-11-23

## 2019-08-20 MED ORDER — METOPROLOL SUCCINATE ER 25 MG PO TB24
12.5000 mg | ORAL_TABLET | Freq: Every day | ORAL | 3 refills | Status: DC
Start: 2019-08-20 — End: 2019-12-11

## 2019-08-20 MED ORDER — ROSUVASTATIN CALCIUM 10 MG PO TABS
10.0000 mg | ORAL_TABLET | Freq: Every day | ORAL | 3 refills | Status: DC
Start: 2019-08-20 — End: 2019-11-17

## 2019-08-20 MED ORDER — ASPIRIN EC 81 MG PO TBEC: 81.0000 mg | DELAYED_RELEASE_TABLET | Freq: Every day | ORAL | 3 refills | Status: AC

## 2019-08-20 NOTE — H&P (View-Only) (Signed)
Cardiology Office Note:    Date:  08/20/2019   ID:  [name] [name], DOB [birth year], MRN [id number]  PCP:  Street, [name] Coup, MD  Cardiologist:  [name] Ripple, DO  Electrophysiologist:  None   Referring MD: Street, [name] [name], *   " I am still not feeling good"  History of Present Illness:    [name] [name] is a 79 y.o. male with a hx of COPD, BPH, dyslipidemia presented initially on July 30, 2019 to be evaluated for chest pain, shortness of breath and palpitations.  At that time I recommended patient undergo testing which included nuclear stress test ZIO monitor as well as an echocardiogram.  He was able to get this testing done.  He is here today to discuss results.  Past Medical History:  Diagnosis Date  . Acute prostatitis 12/19/2015  . Acute respiratory infection   . Allergic rhinitis, seasonal   . Apnea, sleep 09/18/2007   NPSG 2005:  AHI 54/hr New machine 2014 Auto 2014:  Optimal pressure 20cm, but still with some leak and breakthru events.  Download 2015:  Pressure 18, no significant leaks, AHI 12/hr.   . 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 Benign prostatic hyperplasia with lower urinary tract symptoms 12/19/2015  . Breath shortness 07/29/2019  . Central hearing loss   . Chest pain   . Chronic infection of sinus 07/29/2019  . Chronic kidney disease, stage 3   . Chronic obstructive pulmonary disease (COPD) (HCC)   . Chronic prostatitis   . Chronic sinusitis   . 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 DOE (dyspnea on exertion) 07/22/2012  . Dyslipidemia   . Dysuria   . Elevated PSA   . Elevated PSA, less than 10 ng/ml   . Encounter for screening for cardiovascular disorders 07/29/2019  . Enlarged prostate   . Esophageal reflux   . Esophagitis, reflux 07/29/2019  . Family history of prostate cancer 10/22/2018  . Flu vaccine need 07/29/2019  . GERD (gastroesophageal reflux disease)   . Hemorrhoids   . Herniated nucleus pulposus,  L3-4 left   . HLD (hyperlipidemia) 07/29/2019  . Hyperlipidemia   . Hypertrophy of nasal turbinates 07/29/2019  . Hypochromic microcytic anemia   . Internal and external prolapsed hemorrhoids   . LBP (low back pain) 07/29/2019  . 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 Lower back pain   . Lumbar neuritis   . Nasal turbinate hypertrophy   . Obstructive sleep apnea 09/18/2007   NPSG 2005:  AHI 54/hr New machine 2014 Auto 2014:  Optimal pressure 20cm, but still with some leak and breakthru events.  Download 2015:  Pressure 18, no significant leaks, AHI 12/hr.    . OSA (obstructive sleep apnea)    severe  . Other long term (current) drug therapy 07/29/2019  . Overweight   . Pain of left hand 09/09/2017  . 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 Peripheral neuropathy   . Radiculopathy of lumbar region 07/29/2019  . Reflux esophagitis   . Seasonal allergic rhinitis   . Shortness of breath   . Sleep apnea   . 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 APPENDECTOMY  1957  . COLONOSCOPY  02/10/2013   Tubular Adenoma  . NASAL SINUS SURGERY    . SPINE SURGERY      Current Medications: Current Meds  Medication Sig  . Calcium Polycarbophil (KONSYL FIBER PO) Take  1 Dose by mouth.  . fluticasone (FLONASE) 50 MCG/ACT nasal spray Place into both nostrils as needed for allergies or rhinitis.  . omeprazole (PRILOSEC) 20 MG capsule Take by mouth.  . tamsulosin (FLOMAX) 0.4 MG CAPS capsule Take 0.4 mg by mouth daily.  . VITAMIN D, CHOLECALCIFEROL, PO Take by mouth.     Allergies:   Penicillins   Social History   Socioeconomic History  . Marital status: Married    Spouse name: Not on file  . Number of children: Not on file  . Years of education: Not on file  . Highest education level: Not on file  Occupational History  . Occupation: retired  Tobacco Use  . Smoking status: Former Smoker    Packs/day: 1.00    Years: 12.00     Pack years: 12.00    Types: Cigarettes    Start date: 1954    Quit date: 01/30/1964    Years since quitting: 55.5  . Smokeless tobacco: Never Used  Substance and Sexual Activity  . Alcohol use: No  . Drug use: No  . Sexual activity: Not on file  Other Topics Concern  . Not on file  Social History Narrative  . Not on file   Social Determinants of Health   Financial Resource Strain:   . Difficulty of Paying Living Expenses:   Food Insecurity:   . Worried About Running Out of Food in the Last Year:   . Ran Out of Food in the Last Year:   Transportation Needs:   . Lack of Transportation (Medical):   . Lack of Transportation (Non-Medical):   Physical Activity:   . Days of Exercise per Week:   . Minutes of Exercise per Session:   Stress:   . Feeling of Stress :   Social Connections:   . Frequency of Communication with Friends and Family:   . Frequency of Social Gatherings with Friends and Family:   . Attends Religious Services:   . Active Member of Clubs or Organizations:   . Attends Club or Organization Meetings:   . Marital Status:      Family History: The patient's family history includes Allergies in his brother, father, mother, and sister; Asthma in his brother; Cancer in his brother and sister; Heart disease in his brother, mother, and sister.  ROS:   Review of Systems  Constitution: Negative for decreased appetite, fever and weight gain.  HENT: Negative for congestion, ear discharge, hoarse voice and sore throat.   Eyes: Negative for discharge, redness, vision loss in right eye and visual halos.  Cardiovascular: Negative for chest pain, dyspnea on exertion, leg swelling, orthopnea and palpitations.  Respiratory: Negative for cough, hemoptysis, shortness of breath and snoring.   Endocrine: Negative for heat intolerance and polyphagia.  Hematologic/Lymphatic: Negative for bleeding problem. Does not bruise/bleed easily.  Skin: Negative for flushing, nail changes, rash  and suspicious lesions.  Musculoskeletal: Negative for arthritis, joint pain, muscle cramps, myalgias, neck pain and stiffness.  Gastrointestinal: Negative for abdominal pain, bowel incontinence, diarrhea and excessive appetite.  Genitourinary: Negative for decreased libido, genital sores and incomplete emptying.  Neurological: Negative for brief paralysis, focal weakness, headaches and loss of balance.  Psychiatric/Behavioral: Negative for altered mental status, depression and suicidal ideas.  Allergic/Immunologic: Negative for HIV exposure and persistent infections.    EKGs/Labs/Other Studies Reviewed:    The following studies were reviewed today:   EKG: None today  Echocardiogram IMPRESSIONS  1. Left ventricular ejection fraction, by estimation, is 60   to 65%. The left ventricle has normal function. The left ventricle has no regional wall motion abnormalities. Left ventricular diastolic parameters are consistent with Grade I diastolic  dysfunction (impaired relaxation).  2. Right ventricular systolic function is normal. The right ventricular size is normal. There is normal pulmonary artery systolic pressure.  3. The mitral valve is normal in structure. Trivial mitral valve regurgitation. No evidence of mitral stenosis.  4. The aortic valve is normal in structure. Aortic valve regurgitation is not visualized. No aortic stenosis is present.  5. The inferior vena cava is normal in size with greater than 50% respiratory variability, suggesting right atrial pressure of 3 mmHg.  Nuclear stress test  The left ventricular ejection fraction is hyperdynamic (>65%).  Nuclear stress EF: 66%.  There was no ST segment deviation noted during stress.  Defect 1: There is a small defect of mild severity present in the mid inferior and apical inferior location.  Findings consistent with ischemia.  This is a low risk study.  Small area of ischemia invovling mid and apical portion of the  inferior wall.  Normal gated images.  Normal EF   Recent Labs: 07/30/2019: TSH 3.420  Recent Lipid Panel No results found for: CHOL, TRIG, HDL, CHOLHDL, VLDL, LDLCALC, LDLDIRECT  Physical Exam:    VS:  BP 126/72   Pulse 60   Ht 5\' 4"  (1.626 m)   Wt 171 lb 6.4 oz (77.7 kg)   SpO2 97%   BMI 29.42 kg/m     Wt Readings from Last 3 Encounters:  08/20/19 171 lb 6.4 oz (77.7 kg)  08/18/19 162 lb (73.5 kg)  07/30/19 162 lb (73.5 kg)     GEN: Well nourished, well developed in no acute distress HEENT: Normal NECK: No JVD; No carotid bruits LYMPHATICS: No lymphadenopathy CARDIAC: S1S2 noted,RRR, no murmurs, rubs, gallops RESPIRATORY:  Clear to auscultation without rales, wheezing or rhonchi  ABDOMEN: Soft, non-tender, non-distended, +bowel sounds, no guarding. EXTREMITIES: No edema, No cyanosis, no clubbing MUSCULOSKELETAL:  No deformity  SKIN: Warm and dry NEUROLOGIC:  Alert and oriented x 3, non-focal PSYCHIATRIC:  Normal affect, good insight  ASSESSMENT:    No diagnosis found. PLAN:      His nuclear stress test is consistent with ischemia.  Given the fact that he still is experiencing symptoms I like to proceed with a left heart catheterization in this patient.  I have educated patient and his wife about this testing they are agreeable to proceed with this.  The patient understands that risks include but are not limited to stroke (1 in 1000), death (1 in 1000), kidney failure [usually temporary] (1 in 500), bleeding (1 in 200), allergic reaction [possibly serious] (1 in 200), and agrees to proceed.   Of the positive stress test I started patient on aspirin 81 mg daily he takes omeprazole which he says causes a lot of pain for him therefore we can convert that to pantoprazole 40 mg daily.  In addition Crestor will be ordered.  We discussed if his stress test is completely normal we will go back to get him off the aspirin however his LDL shows 131 which warrant him to be on a  statin medication.  In addition he tells me the palpitations are worsening were still waiting for his results from the ZIO monitor which he sent back.  They placed a call into the ZIO rep who is going to expedite these records.  For now low-dose beta-blocker will be given which she  can take at night fully and will help with his symptoms.  His echocardiogram showed no acute findings.  Essentially normal test.  All of his questions in the office were answered with his wife  The patient is in agreement with the above plan. The patient left the office in stable condition.  The patient will follow up in 2 weeks or sooner if needed for postcatheterization visit   Medication Adjustments/Labs and Tests Ordered: Current medicines are reviewed at length with the patient today.  Concerns regarding medicines are outlined above.  No orders of the defined types were placed in this encounter.  No orders of the defined types were placed in this encounter.   There are no Patient Instructions on file for this visit.   Adopting a Healthy Lifestyle.  Know what a healthy weight is for you (roughly BMI <25) and aim to maintain this   Aim for 7+ servings of fruits and vegetables daily   65-80+ fluid ounces of water or unsweet tea for healthy kidneys   Limit to max 1 drink of alcohol per day; avoid smoking/tobacco   Limit animal fats in diet for cholesterol and heart health - choose grass fed whenever available   Avoid highly processed foods, and foods high in saturated/trans fats   Aim for low stress - take time to unwind and care for your mental health   Aim for 150 min of moderate intensity exercise weekly for heart health, and weights twice weekly for bone health   Aim for 7-9 hours of sleep daily   When it comes to diets, agreement about the perfect plan isnt easy to find, even among the experts. Experts at the [facility] [employer] developed an idea known as the Healthy Eating  Plate. Just imagine a plate divided into logical, healthy portions.   The emphasis is on diet quality:   Load up on vegetables and fruits - one-half of your plate: Aim for color and variety, and remember that potatoes dont count.   Go for whole grains - one-quarter of your plate: Whole wheat, barley, wheat berries, quinoa, oats, brown rice, and foods made with them. If you want pasta, go with whole wheat pasta.   Protein power - one-quarter of your plate: Fish, chicken, beans, and nuts are all healthy, versatile protein sources. Limit red meat.   The diet, however, does go beyond the plate, offering a few other suggestions.   Use healthy plant oils, such as olive, canola, soy, corn, sunflower and peanut. Check the labels, and avoid partially hydrogenated oil, which have unhealthy trans fats.   If youre thirsty, drink water. Coffee and tea are good in moderation, but skip sugary drinks and limit milk and dairy products to one or two daily servings.   The type of carbohydrate in the diet is more important than the amount. Some sources of carbohydrates, such as vegetables, fruits, whole grains, and beans-are healthier than others.   Finally, stay active  Signed, [name] Ripple, DO  08/20/2019 12:01 PM    [facility]

## 2019-08-20 NOTE — Patient Instructions (Addendum)
Medication Instructions:  Your physician has recommended you make the following change in your medication:   Stop Omperazole. Start Toprol XL 12.5 mg daily. Take 81 mg coated aspirin daily Take 40 mg protonix daily Take 10 mg crestor daily  *If you need a refill on your cardiac medications before your next appointment, please call your pharmacy*   Lab Work: Your labs were done for your cath.  If you have labs (blood work) drawn today and your tests are completely normal, you will receive your results only by: Marland Kitchen MyChart Message (if you have MyChart) OR . A paper copy in the mail If you have any lab test that is abnormal or we need to change your treatment, we will call you to review the results.   Testing/Procedures:    [facility] [place] [address] [place] [address] Dept: [phone] Loc: [phone]  [name] [name]  08/20/2019  You are scheduled for a Cardiac Catheterization on Monday, August 2 with Dr. Verne Carrow.  1. Please arrive at the [facility] (Main Entrance A) at [facility]: [address] [place], [place] [address] at 5:30 AM (This time is two hours before your procedure to ensure your preparation). Free valet parking service is available.   Special note: Every effort is made to have your procedure done on time. Please understand that emergencies sometimes delay scheduled procedures.  2. Diet: Do not eat solid foods after midnight.  The patient may have clear liquids until 5am upon the day of the procedure.  3. Labs: You had labs done in the office for your cath.  4. Medication instructions in preparation for your procedure:   Contrast Allergy: No  On the morning of your procedure, take your Aspirin and any morning medicines NOT listed above.  You may use sips of water.  5. Plan for one night stay--bring personal belongings. 6. Bring a current list of your  medications and current insurance cards. 7. You MUST have a responsible person to drive you home. 8. Someone MUST be with you the first 24 hours after you arrive home or your discharge will be delayed. 9. Please wear clothes that are easy to get on and off and wear slip-on shoes.  Thank you for allowing [place] to care for you!   -- [facility]    Follow-Up: At [facility], you and your health needs are our priority.  As part of our continuing mission to provide you with exceptional heart care, we have created designated Provider Care Teams.  These Care Teams include your primary Cardiologist (physician) and Advanced Practice Providers (APPs -  Physician Assistants and Nurse Practitioners) who all work together to provide you with the care you need, when you need it.  We recommend signing up for the patient portal called "MyChart".  Sign up information is provided on this After Visit Summary.  MyChart is used to connect with patients for Virtual Visits (Telemedicine).  Patients are able to view lab/test results, encounter notes, upcoming appointments, etc.  Non-urgent messages can be sent to your provider as well.   To learn more about what you can do with MyChart, go to [email or web address].    Your next appointment:   3 week(s)  The format for your next appointment:   In Person  Provider:   Thomasene Ripple, DO   Other Instructions Pantoprazole tablets What is this medicine? PANTOPRAZOLE (pan TOE pra zole) prevents  the production of acid in the stomach. It is used to treat gastroesophageal reflux disease (GERD), inflammation of the esophagus, and Zollinger-Ellison syndrome. This medicine may be used for other purposes; ask your health care provider or pharmacist if you have questions. COMMON BRAND NAME(S): Protonix What should I tell my health care provider before I take this medicine? They need to know if you have any of these conditions:  liver  disease  low levels of magnesium in the blood  lupus  an unusual or allergic reaction to omeprazole, lansoprazole, pantoprazole, rabeprazole, other medicines, foods, dyes, or preservatives  pregnant or trying to get pregnant  breast-feeding How should I use this medicine? Take this medicine by mouth. Swallow the tablets whole with a drink of water. Follow the directions on the prescription label. Do not crush, break, or chew. Take your medicine at regular intervals. Do not take your medicine more often than directed. Talk to your pediatrician regarding the use of this medicine in children. While this drug may be prescribed for children as young as 5 years for selected conditions, precautions do apply. Overdosage: If you think you have taken too much of this medicine contact a poison control center or emergency room at once. NOTE: This medicine is only for you. Do not share this medicine with others. What if I miss a dose? If you miss a dose, take it as soon as you can. If it is almost time for your next dose, take only that dose. Do not take double or extra doses. What may interact with this medicine? Do not take this medicine with any of the following medications:  atazanavir  nelfinavir This medicine may also interact with the following medications:  ampicillin  delavirdine  erlotinib  iron salts  medicines for fungal infections like ketoconazole, itraconazole and voriconazole  methotrexate  mycophenolate mofetil  warfarin This list may not describe all possible interactions. Give your health care provider a list of all the medicines, herbs, non-prescription drugs, or dietary supplements you use. Also tell them if you smoke, drink alcohol, or use illegal drugs. Some items may interact with your medicine. What should I watch for while using this medicine? It can take several days before your stomach pain gets better. Check with your doctor or health care provider if your  condition does not start to get better, or if it gets worse. This medicine may cause serious skin reactions. They can happen weeks to months after starting the medicine. Contact your health care provider right away if you notice fevers or flu-like symptoms with a rash. The rash may be red or purple and then turn into blisters or peeling of the skin. Or, you might notice a red rash with swelling of the face, lips or lymph nodes in your neck or under your arms. You may need blood work done while you are taking this medicine. This medicine may cause a decrease in vitamin B12. You should make sure that you get enough vitamin B12 while you are taking this medicine. Discuss the foods you eat and the vitamins you take with your health care provider. What side effects may I notice from receiving this medicine? Side effects that you should report to your doctor or health care professional as soon as possible:   allergic reactions like skin rash, itching or hives, swelling of the face, lips, or tongue   bone, muscle or joint pain   breathing problems   chest pain or chest tightness   dark yellow  or brown urine   dizziness   fast, irregular heartbeat   feeling faint or lightheaded   fever or sore throat   muscle spasm   palpitations   rash on cheeks or arms that gets worse in the sun   redness, blistering, peeling or loosening of the skin, including inside the mouth   seizures  stomach polyps   tremors   unusual bleeding or bruising   unusually weak or tired   yellowing of the eyes or skin Side effects that usually do not require medical attention (report to your doctor or health care professional if they continue or are bothersome):   constipation   diarrhea   dry mouth   headache   nausea This list may not describe all possible side effects. Call your doctor for medical advice about side effects. You may report side effects to FDA at  1-800-FDA-1088. Where should I keep my medicine? Keep out of the reach of children. Store at room temperature between 15 and 30 degrees C (59 and 86 degrees F). Protect from light and moisture. Throw away any unused medicine after the expiration date. NOTE: This sheet is a summary. It may not cover all possible information. If you have questions about this medicine, talk to your doctor, pharmacist, or health care provider.  2020 Elsevier/Gold Standard (2018-04-25 13:18:32)  Aspirin and Your Heart  Aspirin is a medicine that prevents the cells in the blood that are used for clotting, called platelets, from sticking together. Aspirin can be used to help reduce the risk of blood clots, heart attacks, and other heart-related problems. Can I take aspirin? Your health care provider will help you determine whether it is safe and beneficial for you to take aspirin daily. Taking aspirin daily may be helpful if you:  Have had a heart attack or chest pain.  Are at risk for a heart attack.  Have undergone open-heart surgery, such as coronary artery bypass surgery (CABG).  Have had coronary angioplasty or a stent.  Have had certain types of stroke or transient ischemic attack (TIA).  Have peripheral artery disease (PAD).  Have chronic heart rhythm problems such as atrial fibrillation and cannot take an anticoagulant.  Have valve disease or have had surgery on a valve. What are the risks? Daily use of aspirin can cause side effects. Some of these include:  Bleeding. Bleeding problems can be minor or serious. An example of a minor problem is a cut that does not stop bleeding. An example of a more serious problem is stomach bleeding or, rarely, bleeding into the brain. Your risk of bleeding is increased if you are also taking non-steroidal anti-inflammatory drugs (NSAIDs).  Increased bruising.  Upset stomach.  An allergic reaction. People who have nasal polyps have an increased risk of  developing an aspirin allergy. General guidelines  Take aspirin only as told by your health care provider. Make sure that you understand how much you should take and what form you should take. The two forms of aspirin are: ? Non-enteric-coated.This type of aspirin does not have a coating and is absorbed quickly. This type of aspirin also comes in a chewable form. ? Enteric-coated. This type of aspirin has a coating that releases the medicine very slowly. Enteric-coated aspirin might cause less stomach upset than non-enteric-coated aspirin. This type of aspirin should not be chewed or crushed.  Limit alcohol intake to no more than 1 drink a day for nonpregnant women and 2 drinks a day for men. Drinking  alcohol increases your risk of bleeding. One drink equals 12 oz of beer, 5 oz of wine, or 1 oz of hard liquor. Contact a health care provider if you:  Have unusual bleeding or bruising.  Have stomach pain or nausea.  Have ringing in your ears.  Have an allergic reaction that causes: ? Hives. ? Itchy skin. ? Swelling of the lips, tongue, or face. Get help right away if you:  Notice that your bowel movements are bloody, dark red, or black in color.  Vomit or cough up blood.  Have blood in your urine.  Cough, have noisy breathing (wheeze), or feel short of breath.  Have chest pain, especially if the pain spreads to the arms, back, neck, or jaw.  Have a severe headache, or a headache with confusion, or dizziness. These symptoms may represent a serious problem that is an emergency. Do not wait to see if the symptoms will go away. Get medical help right away. Call your local emergency services (911 in the [place] Do not drive yourself to the hospital. Summary  Aspirin can be used to help reduce the risk of blood clots, heart attacks, and other heart-related problems.  Daily use of aspirin can increase your risk of side effects. Your health care provider will help you determine whether it  is safe and beneficial for you to take aspirin daily.  Take aspirin only as told by your health care provider. Make sure that you understand how much you can take and what form you can take. This information is not intended to replace advice given to you by your health care provider. Make sure you discuss any questions you have with your health care provider. Document Revised: 11/15/2016 Document Reviewed: 11/15/2016 Elsevier Patient Education  2020 Elsevier Inc. Rosuvastatin Tablets What is this medicine? ROSUVASTATIN (roe SOO va sta tin) is known as a HMG-CoA reductase inhibitor or 'statin'. It lowers cholesterol and triglycerides in the blood. This drug may also reduce the risk of heart attack, stroke, or other health problems in patients with risk factors for heart disease. Diet and lifestyle changes are often used with this drug. This medicine may be used for other purposes; ask your health care provider or pharmacist if you have questions. COMMON BRAND NAME(S): Crestor What should I tell my health care provider before I take this medicine? They need to know if you have any of these conditions:  diabetes  if you often drink alcohol  history of stroke  kidney disease  liver disease  muscle aches or weakness  thyroid disease  an unusual or allergic reaction to rosuvastatin, other medicines, foods, dyes, or preservatives  pregnant or trying to get pregnant  breast-feeding How should I use this medicine? Take this medicine by mouth with a glass of water. Follow the directions on the prescription label. Do not cut, crush or chew this medicine. You can take this medicine with or without food. Take your doses at regular intervals. Do not take your medicine more often than directed. Talk to your pediatrician regarding the use of this medicine in children. While this drug may be prescribed for children as young as 85 years old for selected conditions, precautions do apply. Overdosage:  If you think you have taken too much of this medicine contact a poison control center or emergency room at once. NOTE: This medicine is only for you. Do not share this medicine with others. What if I miss a dose? If you miss a dose, take it as  soon as you can. If your next dose is to be taken in less than 12 hours, then do not take the missed dose. Take the next dose at your regular time. Do not take double or extra doses. What may interact with this medicine? Do not take this medicine with any of the following medications:  herbal medicines like red yeast rice This medicine may also interact with the following medications:  alcohol  antacids containing aluminum hydroxide or magnesium hydroxide  cyclosporine  other medicines for high cholesterol  some medicines for HIV infection  warfarin This list may not describe all possible interactions. Give your health care provider a list of all the medicines, herbs, non-prescription drugs, or dietary supplements you use. Also tell them if you smoke, drink alcohol, or use illegal drugs. Some items may interact with your medicine. What should I watch for while using this medicine? Visit your doctor or health care professional for regular check-ups. You may need regular tests to make sure your liver is working properly. Your health care professional may tell you to stop taking this medicine if you develop muscle problems. If your muscle problems do not go away after stopping this medicine, contact your health care professional. Do not become pregnant while taking this medicine. Women should inform their health care professional if they wish to become pregnant or think they might be pregnant. There is a potential for serious side effects to an unborn child. Talk to your health care professional or pharmacist for more information. Do not breast-feed an infant while taking this medicine. This medicine may increase blood sugar. Ask your healthcare provider  if changes in diet or medicines are needed if you have diabetes. If you are going to need surgery or other procedure, tell your doctor that you are using this medicine. This drug is only part of a total heart-health program. Your doctor or a dietician can suggest a low-cholesterol and low-fat diet to help. Avoid alcohol and smoking, and keep a proper exercise schedule. This medicine may cause a decrease in Co-Enzyme Q-10. You should make sure that you get enough Co-Enzyme Q-10 while you are taking this medicine. Discuss the foods you eat and the vitamins you take with your health care professional. What side effects may I notice from receiving this medicine? Side effects that you should report to your doctor or health care professional as soon as possible:  allergic reactions like skin rash, itching or hives, swelling of the face, lips, or tongue  confusion  joint pain  loss of memory  redness, blistering, peeling or loosening of the skin, including inside the mouth  signs and symptoms of high blood sugar such as being more thirsty or hungry or having to urinate more than normal. You may also feel very tired or have blurry vision.  signs and symptoms of muscle injury like dark urine; trouble passing urine or change in the amount of urine; unusually weak or tired; muscle pain or side or back pain  yellowing of the eyes or skin Side effects that usually do not require medical attention (report to your doctor or health care professional if they continue or are bothersome):  constipation  diarrhea  dizziness  gas  headache  nausea  stomach pain  trouble sleeping  upset stomach This list may not describe all possible side effects. Call your doctor for medical advice about side effects. You may report side effects to FDA at 1-800-FDA-1088. Where should I keep my medicine? Keep out of  the reach of children. Store at room temperature between 20 and 25 degrees C (68 and 77 degrees  F). Keep container tightly closed (protect from moisture). Throw away any unused medicine after the expiration date. NOTE: This sheet is a summary. It may not cover all possible information. If you have questions about this medicine, talk to your doctor, pharmacist, or health care provider.  2020 Elsevier/Gold Standard (2017-11-07 08:25:08) Metoprolol Extended-Release Tablets What is this medicine? METOPROLOL (me TOE proe lole) is a beta blocker. It decreases the amount of work your heart has to do and helps your heart beat regularly. It treats high blood pressure and/or prevent chest pain (also called angina). It also treats heart failure. This medicine may be used for other purposes; ask your health care provider or pharmacist if you have questions. COMMON BRAND NAME(S): toprol, Toprol XL What should I tell my health care provider before I take this medicine? They need to know if you have any of these conditions:  diabetes  heart or vessel disease like slow heart rate, worsening heart failure, heart block, sick sinus syndrome or Raynaud's disease  kidney disease  liver disease  lung or breathing disease, like asthma or emphysema  pheochromocytoma  thyroid disease  an unusual or allergic reaction to metoprolol, other beta-blockers, medicines, foods, dyes, or preservatives  pregnant or trying to get pregnant  breast-feeding How should I use this medicine? Take this drug by mouth. Take it as directed on the prescription label at the same time every day. Take it with food. You may cut the tablet in half if it is scored (has a line in the middle of it). This may help you swallow the tablet if the whole tablet is too big. Be sure to take both halves. Do not take just one-half of the tablet. Keep taking it unless your health care provider tells you to stop. Talk to your health care provider about the use of this drug in children. While it may be prescribed for children as young as 6 for  selected conditions, precautions do apply. Overdosage: If you think you have taken too much of this medicine contact a poison control center or emergency room at once. NOTE: This medicine is only for you. Do not share this medicine with others. What if I miss a dose? If you miss a dose, take it as soon as you can. If it is almost time for your next dose, take only that dose. Do not take double or extra doses. What may interact with this medicine? This medicine may interact with the following medications:  certain medicines for blood pressure, heart disease, irregular heart beat  certain medicines for depression, like monoamine oxidase (MAO) inhibitors, fluoxetine, or paroxetine  clonidine  dobutamine  epinephrine  isoproterenol  reserpine This list may not describe all possible interactions. Give your health care provider a list of all the medicines, herbs, non-prescription drugs, or dietary supplements you use. Also tell them if you smoke, drink alcohol, or use illegal drugs. Some items may interact with your medicine. What should I watch for while using this medicine? Visit your doctor or health care professional for regular check ups. Contact your doctor right away if your symptoms worsen. Check your blood pressure and pulse rate regularly. Ask your health care professional what your blood pressure and pulse rate should be, and when you should contact them. You may get drowsy or dizzy. Do not drive, use machinery, or do anything that needs mental alertness until  you know how this medicine affects you. Do not sit or stand up quickly, especially if you are an older patient. This reduces the risk of dizzy or fainting spells. Contact your doctor if these symptoms continue. Alcohol may interfere with the effect of this medicine. Avoid alcoholic drinks. This medicine may increase blood sugar. Ask your healthcare provider if changes in diet or medicines are needed if you have diabetes. What  side effects may I notice from receiving this medicine? Side effects that you should report to your doctor or health care professional as soon as possible:  allergic reactions like skin rash, itching or hives  cold or numb hands or feet  depression  difficulty breathing  faint  fever with sore throat  irregular heartbeat, chest pain  rapid weight gain   signs and symptoms of high blood sugar such as being more thirsty or hungry or having to urinate more than normal. You may also feel very tired or have blurry vision.  swollen legs or ankles Side effects that usually do not require medical attention (report to your doctor or health care professional if they continue or are bothersome):  anxiety or nervousness  change in sex drive or performance  dry skin  headache  nightmares or trouble sleeping  short term memory loss  stomach upset or diarrhea This list may not describe all possible side effects. Call your doctor for medical advice about side effects. You may report side effects to FDA at 1-800-FDA-1088. Where should I keep my medicine? Keep out of the reach of children and pets. Store at room temperature between 20 and 25 degrees C (68 and 77 degrees F). Throw away any unused drug after the expiration date. NOTE: This sheet is a summary. It may not cover all possible information. If you have questions about this medicine, talk to your doctor, pharmacist, or health care provider.  2020 Elsevier/Gold Standard (2018-08-28 18:23:00)

## 2019-08-20 NOTE — Progress Notes (Signed)
Cardiology Office Note:    Date:  08/20/2019   ID:  [name] [name], DOB [birth year], MRN [id number]  PCP:  Street, [name], MD  Cardiologist:  Thomasene Ripple, DO  Electrophysiologist:  None   Referring MD: Street, [name], *   " I am still not feeling good"  History of Present Illness:    [name] [name] is a 79 y.o. male with a hx of COPD, BPH, dyslipidemia presented initially on July 30, 2019 to be evaluated for chest pain, shortness of breath and palpitations.  At that time I recommended patient undergo testing which included nuclear stress test ZIO monitor as well as an echocardiogram.  He was able to get this testing done.  He is here today to discuss results.  Past Medical History:  Diagnosis Date  . Acute prostatitis 12/19/2015  . Acute respiratory infection   . Allergic rhinitis, seasonal   . Apnea, sleep 09/18/2007   NPSG 2005:  AHI 54/hr New machine 2014 Auto 2014:  Optimal pressure 20cm, but still with some leak and breakthru events.  Download 2015:  Pressure 18, no significant leaks, AHI 12/hr.   . 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 Benign prostatic hyperplasia with lower urinary tract symptoms 12/19/2015  . Breath shortness 07/29/2019  . Central hearing loss   . Chest pain   . Chronic infection of sinus 07/29/2019  . Chronic kidney disease, stage 3   . Chronic obstructive pulmonary disease (COPD) (HCC)   . Chronic prostatitis   . Chronic sinusitis   . 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 DOE (dyspnea on exertion) 07/22/2012  . Dyslipidemia   . Dysuria   . Elevated PSA   . Elevated PSA, less than 10 ng/ml   . Encounter for screening for cardiovascular disorders 07/29/2019  . Enlarged prostate   . Esophageal reflux   . Esophagitis, reflux 07/29/2019  . Family history of prostate cancer 10/22/2018  . Flu vaccine need 07/29/2019  . GERD (gastroesophageal reflux disease)   . Hemorrhoids   . Herniated nucleus pulposus,  L3-4 left   . HLD (hyperlipidemia) 07/29/2019  . Hyperlipidemia   . Hypertrophy of nasal turbinates 07/29/2019  . Hypochromic microcytic anemia   . Internal and external prolapsed hemorrhoids   . LBP (low back pain) 07/29/2019  . 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 Lower back pain   . Lumbar neuritis   . Nasal turbinate hypertrophy   . Obstructive sleep apnea 09/18/2007   NPSG 2005:  AHI 54/hr New machine 2014 Auto 2014:  Optimal pressure 20cm, but still with some leak and breakthru events.  Download 2015:  Pressure 18, no significant leaks, AHI 12/hr.    . OSA (obstructive sleep apnea)    severe  . Other long term (current) drug therapy 07/29/2019  . Overweight   . Pain of left hand 09/09/2017  . 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 Peripheral neuropathy   . Radiculopathy of lumbar region 07/29/2019  . Reflux esophagitis   . Seasonal allergic rhinitis   . Shortness of breath   . Sleep apnea   . 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 APPENDECTOMY  1957  . COLONOSCOPY  02/10/2013   Tubular Adenoma  . NASAL SINUS SURGERY    . SPINE SURGERY      Current Medications: Current Meds  Medication Sig  . Calcium Polycarbophil (KONSYL FIBER PO) Take  1 Dose by mouth.  . fluticasone (FLONASE) 50 MCG/ACT nasal spray Place into both nostrils as needed for allergies or rhinitis.  Marland Kitchen. omeprazole (PRILOSEC) 20 MG capsule Take by mouth.  . tamsulosin (FLOMAX) 0.4 MG CAPS capsule Take 0.4 mg by mouth daily.  Marland Kitchen. VITAMIN D, CHOLECALCIFEROL, PO Take by mouth.     Allergies:   Penicillins   Social History   Socioeconomic History  . Marital status: Married    Spouse name: Not on file  . Number of children: Not on file  . Years of education: Not on file  . Highest education level: Not on file  Occupational History  . Occupation: retired  Tobacco Use  . Smoking status: Former Smoker    Packs/day: 1.00    Years: 12.00     Pack years: 12.00    Types: Cigarettes    Start date: 311954    Quit date: 01/30/1964    Years since quitting: 55.5  . Smokeless tobacco: Never Used  Substance and Sexual Activity  . Alcohol use: No  . Drug use: No  . Sexual activity: Not on file  Other Topics Concern  . Not on file  Social History Narrative  . Not on file   Social Determinants of Health   Financial Resource Strain:   . Difficulty of Paying Living Expenses:   Food Insecurity:   . Worried About Programme researcher, broadcasting/film/videounning Out of Food in the Last Year:   . Baristaan Out of Food in the Last Year:   Transportation Needs:   . Freight forwarderLack of Transportation (Medical):   Marland Kitchen. Lack of Transportation (Non-Medical):   Physical Activity:   . Days of Exercise per Week:   . Minutes of Exercise per Session:   Stress:   . Feeling of Stress :   Social Connections:   . Frequency of Communication with Friends and Family:   . Frequency of Social Gatherings with Friends and Family:   . Attends Religious Services:   . Active Member of Clubs or Organizations:   . Attends BankerClub or Organization Meetings:   Marland Kitchen. Marital Status:      Family History: The patient's family history includes Allergies in his brother, father, mother, and sister; Asthma in his brother; Cancer in his brother and sister; Heart disease in his brother, mother, and sister.  ROS:   Review of Systems  Constitution: Negative for decreased appetite, fever and weight gain.  HENT: Negative for congestion, ear discharge, hoarse voice and sore throat.   Eyes: Negative for discharge, redness, vision loss in right eye and visual halos.  Cardiovascular: Negative for chest pain, dyspnea on exertion, leg swelling, orthopnea and palpitations.  Respiratory: Negative for cough, hemoptysis, shortness of breath and snoring.   Endocrine: Negative for heat intolerance and polyphagia.  Hematologic/Lymphatic: Negative for bleeding problem. Does not bruise/bleed easily.  Skin: Negative for flushing, nail changes, rash  and suspicious lesions.  Musculoskeletal: Negative for arthritis, joint pain, muscle cramps, myalgias, neck pain and stiffness.  Gastrointestinal: Negative for abdominal pain, bowel incontinence, diarrhea and excessive appetite.  Genitourinary: Negative for decreased libido, genital sores and incomplete emptying.  Neurological: Negative for brief paralysis, focal weakness, headaches and loss of balance.  Psychiatric/Behavioral: Negative for altered mental status, depression and suicidal ideas.  Allergic/Immunologic: Negative for HIV exposure and persistent infections.    EKGs/Labs/Other Studies Reviewed:    The following studies were reviewed today:   EKG: None today  Echocardiogram IMPRESSIONS  1. Left ventricular ejection fraction, by estimation, is 60  to 65%. The left ventricle has normal function. The left ventricle has no regional wall motion abnormalities. Left ventricular diastolic parameters are consistent with Grade I diastolic  dysfunction (impaired relaxation).  2. Right ventricular systolic function is normal. The right ventricular size is normal. There is normal pulmonary artery systolic pressure.  3. The mitral valve is normal in structure. Trivial mitral valve regurgitation. No evidence of mitral stenosis.  4. The aortic valve is normal in structure. Aortic valve regurgitation is not visualized. No aortic stenosis is present.  5. The inferior vena cava is normal in size with greater than 50% respiratory variability, suggesting right atrial pressure of 3 mmHg.  Nuclear stress test  The left ventricular ejection fraction is hyperdynamic (>65%).  Nuclear stress EF: 66%.  There was no ST segment deviation noted during stress.  Defect 1: There is a small defect of mild severity present in the mid inferior and apical inferior location.  Findings consistent with ischemia.  This is a low risk study.  Small area of ischemia invovling mid and apical portion of the  inferior wall.  Normal gated images.  Normal EF   Recent Labs: 07/30/2019: TSH 3.420  Recent Lipid Panel No results found for: CHOL, TRIG, HDL, CHOLHDL, VLDL, LDLCALC, LDLDIRECT  Physical Exam:    VS:  BP 126/72   Pulse 60   Ht 5\' 4"  (1.626 m)   Wt 171 lb 6.4 oz (77.7 kg)   SpO2 97%   BMI 29.42 kg/m     Wt Readings from Last 3 Encounters:  08/20/19 171 lb 6.4 oz (77.7 kg)  08/18/19 162 lb (73.5 kg)  07/30/19 162 lb (73.5 kg)     GEN: Well nourished, well developed in no acute distress HEENT: Normal NECK: No JVD; No carotid bruits LYMPHATICS: No lymphadenopathy CARDIAC: S1S2 noted,RRR, no murmurs, rubs, gallops RESPIRATORY:  Clear to auscultation without rales, wheezing or rhonchi  ABDOMEN: Soft, non-tender, non-distended, +bowel sounds, no guarding. EXTREMITIES: No edema, No cyanosis, no clubbing MUSCULOSKELETAL:  No deformity  SKIN: Warm and dry NEUROLOGIC:  Alert and oriented x 3, non-focal PSYCHIATRIC:  Normal affect, good insight  ASSESSMENT:    No diagnosis found. PLAN:      His nuclear stress test is consistent with ischemia.  Given the fact that he still is experiencing symptoms I like to proceed with a left heart catheterization in this patient.  I have educated patient and his wife about this testing they are agreeable to proceed with this.  The patient understands that risks include but are not limited to stroke (1 in 1000), death (1 in 1000), kidney failure [usually temporary] (1 in 500), bleeding (1 in 200), allergic reaction [possibly serious] (1 in 200), and agrees to proceed.   Of the positive stress test I started patient on aspirin 81 mg daily he takes omeprazole which he says causes a lot of pain for him therefore we can convert that to pantoprazole 40 mg daily.  In addition Crestor will be ordered.  We discussed if his stress test is completely normal we will go back to get him off the aspirin however his LDL shows 131 which warrant him to be on a  statin medication.  In addition he tells me the palpitations are worsening were still waiting for his results from the ZIO monitor which he sent back.  They placed a call into the ZIO rep who is going to expedite these records.  For now low-dose beta-blocker will be given which she  can take at night fully and will help with his symptoms.  His echocardiogram showed no acute findings.  Essentially normal test.  All of his questions in the office were answered with his wife  The patient is in agreement with the above plan. The patient left the office in stable condition.  The patient will follow up in 2 weeks or sooner if needed for postcatheterization visit   Medication Adjustments/Labs and Tests Ordered: Current medicines are reviewed at length with the patient today.  Concerns regarding medicines are outlined above.  No orders of the defined types were placed in this encounter.  No orders of the defined types were placed in this encounter.   There are no Patient Instructions on file for this visit.   Adopting a Healthy Lifestyle.  Know what a healthy weight is for you (roughly BMI <25) and aim to maintain this   Aim for 7+ servings of fruits and vegetables daily   65-80+ fluid ounces of water or unsweet tea for healthy kidneys   Limit to max 1 drink of alcohol per day; avoid smoking/tobacco   Limit animal fats in diet for cholesterol and heart health - choose grass fed whenever available   Avoid highly processed foods, and foods high in saturated/trans fats   Aim for low stress - take time to unwind and care for your mental health   Aim for 150 min of moderate intensity exercise weekly for heart health, and weights twice weekly for bone health   Aim for 7-9 hours of sleep daily   When it comes to diets, agreement about the perfect plan isnt easy to find, even among the experts. Experts at the [facility] [employer] developed an idea known as the Healthy Eating  Plate. Just imagine a plate divided into logical, healthy portions.   The emphasis is on diet quality:   Load up on vegetables and fruits - one-half of your plate: Aim for color and variety, and remember that potatoes dont count.   Go for whole grains - one-quarter of your plate: Whole wheat, barley, wheat berries, quinoa, oats, brown rice, and foods made with them. If you want pasta, go with whole wheat pasta.   Protein power - one-quarter of your plate: Fish, chicken, beans, and nuts are all healthy, versatile protein sources. Limit red meat.   The diet, however, does go beyond the plate, offering a few other suggestions.   Use healthy plant oils, such as olive, canola, soy, corn, sunflower and peanut. Check the labels, and avoid partially hydrogenated oil, which have unhealthy trans fats.   If youre thirsty, drink water. Coffee and tea are good in moderation, but skip sugary drinks and limit milk and dairy products to one or two daily servings.   The type of carbohydrate in the diet is more important than the amount. Some sources of carbohydrates, such as vegetables, fruits, whole grains, and beans-are healthier than others.   Finally, stay active  Signed, Thomasene Ripple, DO  08/20/2019 12:01 PM    [facility]

## 2019-08-21 ENCOUNTER — Telehealth: Payer: Self-pay

## 2019-08-21 LAB — BASIC METABOLIC PANEL
BUN/Creatinine Ratio: 16 (ref 10–24)
BUN: 19 mg/dL (ref 8–27)
CO2: 21 mmol/L (ref 20–29)
Calcium: 9.3 mg/dL (ref 8.6–10.2)
Chloride: 108 mmol/L — ABNORMAL HIGH (ref 96–106)
Creatinine, Ser: 1.22 mg/dL (ref 0.76–1.27)
GFR calc Af Amer: 64 mL/min/{1.73_m2} (ref >59)
GFR calc non Af Amer: 55 mL/min/{1.73_m2} — ABNORMAL LOW (ref >59)
Glucose: 88 mg/dL (ref 65–99)
Potassium: 4.3 mmol/L (ref 3.5–5.2)
Sodium: 140 mmol/L (ref 134–144)

## 2019-08-21 LAB — CBC WITH DIFFERENTIAL/PLATELET
Basophils Absolute: 0.1 10*3/uL (ref 0.0–0.2)
Basos: 1 %
EOS (ABSOLUTE): 0.2 10*3/uL (ref 0.0–0.4)
Eos: 4 %
Hematocrit: 41.9 % (ref 37.5–51.0)
Hemoglobin: 14 g/dL (ref 13.0–17.7)
Immature Grans (Abs): 0 10*3/uL (ref 0.0–0.1)
Immature Granulocytes: 0 %
Lymphocytes Absolute: 1.6 10*3/uL (ref 0.7–3.1)
Lymphs: 29 %
MCH: 30.1 pg (ref 26.6–33.0)
MCHC: 33.4 g/dL (ref 31.5–35.7)
MCV: 90 fL (ref 79–97)
Monocytes Absolute: 0.5 10*3/uL (ref 0.1–0.9)
Monocytes: 10 %
Neutrophils Absolute: 3 10*3/uL (ref 1.4–7.0)
Neutrophils: 56 %
Platelets: 142 10*3/uL — ABNORMAL LOW (ref 150–450)
RBC: 4.65 x10E6/uL (ref 4.14–5.80)
RDW: 13.9 % (ref 11.6–15.4)
WBC: 5.4 10*3/uL (ref 3.4–10.8)

## 2019-08-21 NOTE — Telephone Encounter (Signed)
Spoke with patient regarding results and recommendation.  Patient verbalizes understanding and is agreeable to plan of care. Advised patient to call back with any issues or concerns.  

## 2019-08-21 NOTE — Telephone Encounter (Signed)
Patient's wife is returning call. 

## 2019-08-21 NOTE — Telephone Encounter (Signed)
Left message on patients voicemail to please return our call.   

## 2019-08-21 NOTE — Telephone Encounter (Signed)
-----   Message from Thomasene Ripple, DO sent at 08/21/2019  9:55 AM EDT ----- Stable labs

## 2019-08-27 ENCOUNTER — Telehealth: Payer: Self-pay | Admitting: *Deleted

## 2019-08-27 NOTE — Telephone Encounter (Signed)
Pt contacted pre-catheterization scheduled at [facility] for: Monday August 31, 2019 7:30 AM Verified arrival time and place: [facility] Main Entrance A [facility]) at: 5:30 AM   No solid food after midnight prior to cath, clear liquids until 5 AM day of procedure.  AM meds can be  taken pre-cath with sips of water including: ASA 81 mg   Confirmed patient has responsible adult to drive home post procedure and observe 24 hours after arriving home: yes  You are allowed ONE visitor in the waiting room during your procedure. Both you and your visitor must wear a mask once you enter the hospital.      COVID-19 Pre-Screening Questions:  . In the past 14 days have you had a new cough associated with shortness of breath, fever (100.4 or greater) or sudden loss of taste or sense of smell? Shortness of breath, feels related to heart, no cough, no fever . In the past 14 days have you been around anyone with known Covid 19? no . Any international travel in the past 14 days? no . Have you been vaccinated for COVID-19? Yes, see immunization history   Reviewed procedure/mask/visitor instructions, COVID-19 questions with patient.

## 2019-08-31 ENCOUNTER — Encounter (HOSPITAL_COMMUNITY)
Admission: RE | Disposition: A | Discharge status: Home / Self Care | Payer: Self-pay | Source: Home / Self Care | Attending: Cardiovascular Disease

## 2019-08-31 ENCOUNTER — Encounter (HOSPITAL_COMMUNITY): Payer: Self-pay | Admitting: Cardiovascular Disease

## 2019-08-31 ENCOUNTER — Other Ambulatory Visit: Payer: Self-pay

## 2019-08-31 ENCOUNTER — Ambulatory Visit (HOSPITAL_COMMUNITY)
Admission: RE | Admit: 2019-08-31 | Discharge: 2019-08-31 | Disposition: A | Discharge status: Home / Self Care | Payer: Medicare HMO | Attending: Cardiovascular Disease | Admitting: Cardiovascular Disease

## 2019-08-31 DIAGNOSIS — R9439 Abnormal result of other cardiovascular function study: Secondary | ICD-10-CM

## 2019-08-31 DIAGNOSIS — Z6828 Body mass index (BMI) 28.0-28.9, adult: Secondary | ICD-10-CM | POA: Diagnosis not present

## 2019-08-31 DIAGNOSIS — I2510 Atherosclerotic heart disease of native coronary artery without angina pectoris: Secondary | ICD-10-CM

## 2019-08-31 DIAGNOSIS — Z880 Allergy status to penicillin: Secondary | ICD-10-CM | POA: Diagnosis not present

## 2019-08-31 DIAGNOSIS — K219 Gastro-esophageal reflux disease without esophagitis: Secondary | ICD-10-CM | POA: Insufficient documentation

## 2019-08-31 DIAGNOSIS — R072 Precordial pain: Secondary | ICD-10-CM

## 2019-08-31 DIAGNOSIS — E669 Obesity, unspecified: Secondary | ICD-10-CM | POA: Insufficient documentation

## 2019-08-31 DIAGNOSIS — G4733 Obstructive sleep apnea (adult) (pediatric): Secondary | ICD-10-CM | POA: Insufficient documentation

## 2019-08-31 DIAGNOSIS — Z87891 Personal history of nicotine dependence: Secondary | ICD-10-CM | POA: Insufficient documentation

## 2019-08-31 DIAGNOSIS — Z79899 Other long term (current) drug therapy: Secondary | ICD-10-CM | POA: Diagnosis not present

## 2019-08-31 DIAGNOSIS — R079 Chest pain, unspecified: Secondary | ICD-10-CM | POA: Insufficient documentation

## 2019-08-31 DIAGNOSIS — N183 Chronic kidney disease, stage 3 unspecified: Secondary | ICD-10-CM | POA: Insufficient documentation

## 2019-08-31 DIAGNOSIS — G629 Polyneuropathy, unspecified: Secondary | ICD-10-CM | POA: Insufficient documentation

## 2019-08-31 DIAGNOSIS — J449 Chronic obstructive pulmonary disease, unspecified: Secondary | ICD-10-CM | POA: Insufficient documentation

## 2019-08-31 DIAGNOSIS — E785 Hyperlipidemia, unspecified: Secondary | ICD-10-CM | POA: Insufficient documentation

## 2019-08-31 HISTORY — PX: LEFT HEART CATH AND CORONARY ANGIOGRAPHY: CATH118249

## 2019-08-31 SURGERY — LEFT HEART CATH AND CORONARY ANGIOGRAPHY
Anesthesia: LOCAL

## 2019-08-31 MED ORDER — HEPARIN SODIUM (PORCINE) 1000 UNIT/ML IJ SOLN
INTRAMUSCULAR | Status: AC
  Filled 2019-08-31: qty 1

## 2019-08-31 MED ORDER — VERAPAMIL HCL 2.5 MG/ML IV SOLN
INTRAVENOUS | Status: AC
  Filled 2019-08-31: qty 2

## 2019-08-31 MED ORDER — MIDAZOLAM HCL 2 MG/2ML IJ SOLN
INTRAMUSCULAR | Status: AC
  Filled 2019-08-31: qty 2

## 2019-08-31 MED ORDER — HEPARIN (PORCINE) IN NACL 1000-0.9 UT/500ML-% IV SOLN
INTRAVENOUS | Status: AC
  Filled 2019-08-31: qty 500

## 2019-08-31 MED ORDER — ACETAMINOPHEN 325 MG PO TABS: 650.0000 mg | ORAL_TABLET | ORAL | Status: DC | PRN

## 2019-08-31 MED ORDER — SODIUM CHLORIDE 0.9 % WEIGHT BASED INFUSION
3.0000 mL/kg/h | INTRAVENOUS | Status: AC
  Administered 2019-08-31: 3 mL/kg/h via INTRAVENOUS

## 2019-08-31 MED ORDER — SODIUM CHLORIDE 0.9 % WEIGHT BASED INFUSION: 1.0000 mL/kg/h | INTRAVENOUS | Status: DC

## 2019-08-31 MED ORDER — MIDAZOLAM HCL 2 MG/2ML IJ SOLN
INTRAMUSCULAR | Status: DC | PRN
  Administered 2019-08-31: 1 mg via INTRAVENOUS

## 2019-08-31 MED ORDER — HEPARIN (PORCINE) IN NACL 1000-0.9 UT/500ML-% IV SOLN
INTRAVENOUS | Status: DC | PRN
  Administered 2019-08-31 (×2): 500 mL

## 2019-08-31 MED ORDER — SODIUM CHLORIDE 0.9% FLUSH: 3.0000 mL | INTRAVENOUS | Status: DC | PRN

## 2019-08-31 MED ORDER — SODIUM CHLORIDE 0.9 % IV SOLN: INTRAVENOUS | Status: AC

## 2019-08-31 MED ORDER — ONDANSETRON HCL 4 MG/2ML IJ SOLN: 4.0000 mg | Freq: Four times a day (QID) | INTRAMUSCULAR | Status: DC | PRN

## 2019-08-31 MED ORDER — LIDOCAINE HCL (PF) 1 % IJ SOLN
INTRAMUSCULAR | Status: AC
  Filled 2019-08-31: qty 30

## 2019-08-31 MED ORDER — IOHEXOL 350 MG/ML SOLN
INTRAVENOUS | Status: DC | PRN
  Administered 2019-08-31: 70 mL

## 2019-08-31 MED ORDER — HEPARIN SODIUM (PORCINE) 1000 UNIT/ML IJ SOLN
INTRAMUSCULAR | Status: DC | PRN
  Administered 2019-08-31: 4000 [IU] via INTRAVENOUS

## 2019-08-31 MED ORDER — LIDOCAINE HCL (PF) 1 % IJ SOLN
INTRAMUSCULAR | Status: DC | PRN
  Administered 2019-08-31: 2 mL

## 2019-08-31 MED ORDER — FENTANYL CITRATE (PF) 100 MCG/2ML IJ SOLN
INTRAMUSCULAR | Status: AC
  Filled 2019-08-31: qty 2

## 2019-08-31 MED ORDER — SODIUM CHLORIDE 0.9 % IV SOLN: 250.0000 mL | INTRAVENOUS | Status: DC | PRN

## 2019-08-31 MED ORDER — FENTANYL CITRATE (PF) 100 MCG/2ML IJ SOLN
INTRAMUSCULAR | Status: DC | PRN
  Administered 2019-08-31: 25 ug via INTRAVENOUS

## 2019-08-31 MED ORDER — HYDRALAZINE HCL 20 MG/ML IJ SOLN: 10.0000 mg | INTRAMUSCULAR | Status: DC | PRN

## 2019-08-31 MED ORDER — SODIUM CHLORIDE 0.9% FLUSH: 3.0000 mL | Freq: Two times a day (BID) | INTRAVENOUS | Status: DC

## 2019-08-31 MED ORDER — LABETALOL HCL 5 MG/ML IV SOLN: 10.0000 mg | INTRAVENOUS | Status: DC | PRN

## 2019-08-31 MED ORDER — VERAPAMIL HCL 2.5 MG/ML IV SOLN
INTRAVENOUS | Status: DC | PRN
  Administered 2019-08-31: 10 mL via INTRA_ARTERIAL

## 2019-08-31 SURGICAL SUPPLY — 9 items

## 2019-08-31 NOTE — Interval H&P Note (Signed)
History and Physical Interval Note:  08/31/2019 7:27 AM  [name] [name]  has presented today for surgery, with the diagnosis of abnormal stress test.  The various methods of treatment have been discussed with the patient and family. After consideration of risks, benefits and other options for treatment, the patient has consented to  Procedure(s): LEFT HEART CATH AND CORONARY ANGIOGRAPHY (N/A) as a surgical intervention.  The patient's history has been reviewed, patient examined, no change in status, stable for surgery.  I have reviewed the patient's chart and labs.  Questions were answered to the patient's satisfaction.    Cath Lab Visit (complete for each Cath Lab visit)  Clinical Evaluation Leading to the Procedure:   ACS: No.  Non-ACS:    Anginal Classification: CCS III  Anti-ischemic medical therapy: Minimal Therapy (1 class of medications)  Non-Invasive Test Results: Low-risk stress test findings: cardiac mortality <1%/year  Prior CABG: No previous CABG        Verne Carrow

## 2019-08-31 NOTE — Discharge Instructions (Signed)
Drink plenty of fluid for 48 hours and keep wrist elevated at heart level for 24 hours  Radial Site Care   This sheet gives you information about how to care for yourself after your procedure. Your health care provider may also give you more specific instructions. If you have problems or questions, contact your health care provider. What can I expect after the procedure? After the procedure, it is common to have:  Bruising and tenderness at the catheter insertion area. Follow these instructions at home: Medicines  Take over-the-counter and prescription medicines only as told by your health care provider. Insertion site care 1. Follow instructions from your health care provider about how to take care of your insertion site. Make sure you: ? Wash your hands with soap and water before you change your bandage (dressing). If soap and water are not available, use hand sanitizer. ? remove your dressing as told by your health care provider. In 24 hours 2. Check your insertion site every day for signs of infection. Check for: ? Redness, swelling, or pain. ? Fluid or blood. ? Pus or a bad smell. ? Warmth. 3. Do not take baths, swim, or use a hot tub until your health care provider approves. 4. You may shower 24-48 hours after the procedure, or as directed by your health care provider. ? Remove the dressing and gently wash the site with plain soap and water. ? Pat the area dry with a clean towel. ? Do not rub the site. That could cause bleeding. 5. Do not apply powder or lotion to the site. Activity   1. For 24 hours after the procedure, or as directed by your health care provider: ? Do not flex or bend the affected arm. ? Do not push or pull heavy objects with the affected arm. ? Do not drive yourself home from the hospital or clinic. You may drive 24 hours after the procedure unless your health care provider tells you not to. ? Do not operate machinery or power tools. 2. Do not lift  anything that is heavier than 10 lb (4.5 kg), or the limit that you are told, until your health care provider says that it is safe. For 4 days 3. Ask your health care provider when it is okay to: ? Return to work or school. ? Resume usual physical activities or sports. ? Resume sexual activity. General instructions  If the catheter site starts to bleed, raise your arm and put firm pressure on the site. If the bleeding does not stop, get help right away. This is a medical emergency.  If you went home on the same day as your procedure, a responsible adult should be with you for the first 24 hours after you arrive home.  Keep all follow-up visits as told by your health care provider. This is important. Contact a health care provider if:  You have a fever.  You have redness, swelling, or yellow drainage around your insertion site. Get help right away if:  You have unusual pain at the radial site.  The catheter insertion area swells very fast.  The insertion area is bleeding, and the bleeding does not stop when you hold steady pressure on the area.  Your arm or hand becomes pale, cool, tingly, or numb. These symptoms may represent a serious problem that is an emergency. Do not wait to see if the symptoms will go away. Get medical help right away. Call your local emergency services (911 in the [place] Do not   drive yourself to the hospital. Summary  After the procedure, it is common to have bruising and tenderness at the site.  Follow instructions from your health care provider about how to take care of your radial site wound. Check the wound every day for signs of infection.  Do not lift anything that is heavier than 10 lb (4.5 kg), or the limit that you are told, until your health care provider says that it is safe. This information is not intended to replace advice given to you by your health care provider. Make sure you discuss any questions you have with your health care  provider. Document Revised: 02/20/2017 Document Reviewed: 02/20/2017 Elsevier Patient Education  2020 [employer]  

## 2019-08-31 NOTE — Progress Notes (Signed)
Patient and wife was given discharge instructions. Both verbalized understanding. 

## 2019-09-17 ENCOUNTER — Other Ambulatory Visit: Payer: Self-pay

## 2019-09-17 ENCOUNTER — Ambulatory Visit: Payer: Medicare HMO | Admitting: Cardiology

## 2019-09-17 ENCOUNTER — Encounter: Payer: Self-pay | Admitting: Cardiology

## 2019-09-17 VITALS — BP 114/68 | HR 58 | Ht 64.5 in | Wt 174.0 lb

## 2019-09-17 DIAGNOSIS — I471 Supraventricular tachycardia, unspecified: Secondary | ICD-10-CM | POA: Insufficient documentation

## 2019-09-17 DIAGNOSIS — I10 Essential (primary) hypertension: Secondary | ICD-10-CM

## 2019-09-17 DIAGNOSIS — I2510 Atherosclerotic heart disease of native coronary artery without angina pectoris: Secondary | ICD-10-CM | POA: Insufficient documentation

## 2019-09-17 DIAGNOSIS — I491 Atrial premature depolarization: Secondary | ICD-10-CM | POA: Insufficient documentation

## 2019-09-17 DIAGNOSIS — G4733 Obstructive sleep apnea (adult) (pediatric): Secondary | ICD-10-CM

## 2019-09-17 DIAGNOSIS — E785 Hyperlipidemia, unspecified: Secondary | ICD-10-CM | POA: Diagnosis not present

## 2019-09-17 HISTORY — DX: Atherosclerotic heart disease of native coronary artery without angina pectoris: I25.10

## 2019-09-17 HISTORY — DX: Supraventricular tachycardia, unspecified: I47.10

## 2019-09-17 HISTORY — DX: Obstructive sleep apnea (adult) (pediatric): G47.33

## 2019-09-17 HISTORY — DX: Atrial premature depolarization: I49.1

## 2019-09-17 HISTORY — DX: Essential (primary) hypertension: I10

## 2019-09-17 MED ORDER — NITROGLYCERIN 0.4 MG SL SUBL
0.4000 mg | SUBLINGUAL_TABLET | SUBLINGUAL | 6 refills | Status: DC | PRN
Start: 2019-09-17 — End: 2023-06-11

## 2019-09-17 NOTE — Progress Notes (Signed)
Cardiology Office Note:    Date:  09/17/2019   ID:  [name] [name], DOB July 19, 1940, MRN [id number]  PCP:  Street, [name], MD  Cardiologist:  Thomasene Ripple, DO  Electrophysiologist:  None   Referring MD: Street, [name], *   Chief Complaint  Patient presents with  . Follow-up    History of Present Illness:    [name] [name] is a 79 y.o. male with a hx of coronary artery disease based on recent heart catheterization showed moderate stenosis, COPD, BPH, hyperlipidemia, symptomatic PACs and sleep apnea on CPAP however patient tells me has been a challenge for managing his CPAP.  The patient presents today for follow-up visit.  Last saw the patient August 31, 2019 at that time we discussed his test results which included nuclear stress test which was abnormal, his echocardiogram results as well as a ZIO monitor which show symptomatic paroxysmal supraventricular tachycardia and rare symptomatic PACs.  In the interim the patient did go for left heart catheterization at which time there was a nonobstructive mid LAD lesion which was 50% with no need for any intervention, RCA distal 20% stenosis as well as left circumflex artery with 20% lesion.  The patient tells me that he has been experiencing some fatigue as well as intermittent chest pressure. He tells me that he also has had intermittent palpitation which are brief episodes mostly in the morning.  The fatigue is more bothersome.  Past Medical History:  Diagnosis Date  . Acute prostatitis 12/19/2015  . Acute respiratory infection   . Allergic rhinitis, seasonal   . Apnea, sleep 09/18/2007   NPSG 2005:  AHI 54/hr New machine 2014 Auto 2014:  Optimal pressure 20cm, but still with some leak and breakthru events.  Download 2015:  Pressure 18, no significant leaks, AHI 12/hr.   . 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 Benign prostatic hyperplasia with lower urinary tract symptoms 12/19/2015  . Breath shortness 07/29/2019  . Central  hearing loss   . Chest pain   . Chronic infection of sinus 07/29/2019  . Chronic kidney disease, stage 3   . Chronic obstructive pulmonary disease (COPD) (HCC)   . Chronic prostatitis   . Chronic sinusitis   . 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 DOE (dyspnea on exertion) 07/22/2012  . Dyslipidemia   . Dysuria   . Elevated PSA   . Elevated PSA, less than 10 ng/ml   . Encounter for screening for cardiovascular disorders 07/29/2019  . Enlarged prostate   . Esophageal reflux   . Esophagitis, reflux 07/29/2019  . Family history of prostate cancer 10/22/2018  . Flu vaccine need 07/29/2019  . GERD (gastroesophageal reflux disease)   . Hemorrhoids   . Herniated nucleus pulposus, L3-4 left   . HLD (hyperlipidemia) 07/29/2019  . Hyperlipidemia   . Hypertrophy of nasal turbinates 07/29/2019  . Hypochromic microcytic anemia   . Internal and external prolapsed hemorrhoids   . LBP (low back pain) 07/29/2019  . 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 Lower back pain   . Lumbar neuritis   . Nasal turbinate hypertrophy   . Obstructive sleep apnea 09/18/2007   NPSG 2005:  AHI 54/hr New machine 2014 Auto 2014:  Optimal pressure 20cm, but still with some leak and breakthru events.  Download 2015:  Pressure 18, no significant leaks, AHI 12/hr.    . OSA (obstructive sleep apnea)    severe  . Other long term (current) drug therapy 07/29/2019  .  Overweight   . Pain of left hand 09/09/2017  . 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 Peripheral neuropathy   . Radiculopathy of lumbar region 07/29/2019  . Reflux esophagitis   . Seasonal allergic rhinitis   . Shortness of breath   . Sleep apnea   . 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 APPENDECTOMY  1957  . COLONOSCOPY  02/10/2013   Tubular Adenoma  . LEFT HEART CATH AND CORONARY ANGIOGRAPHY N/A 08/31/2019   Procedure: LEFT HEART CATH AND CORONARY  ANGIOGRAPHY;  Surgeon: Kathleene HazelMcAlhany, Christopher D, MD;  Location: [facility];  Service: Cardiovascular;  Laterality: N/A;  . NASAL SINUS SURGERY    . SPINE SURGERY      Current Medications: Current Meds  Medication Sig  . aspirin EC 81 MG tablet Take 1 tablet (81 mg total) by mouth daily. Swallow whole.  . Calcium Polycarbophil (KONSYL FIBER PO) Take 1 Dose by mouth daily. 1 teaspoon  . cholecalciferol (VITAMIN D) 25 MCG (1000 UNIT) tablet Take 1,000 Units by mouth daily.  . fluticasone (FLONASE) 50 MCG/ACT nasal spray Place 1-2 sprays into both nostrils daily as needed for allergies.   . metoprolol succinate (TOPROL XL) 25 MG 24 hr tablet Take 0.5 tablets (12.5 mg total) by mouth daily.  . naproxen sodium (ALEVE) 220 MG tablet Take 220 mg by mouth 2 (two) times daily as needed (back pain.).   Marland Kitchen. pantoprazole (PROTONIX) 40 MG tablet Take 1 tablet (40 mg total) by mouth daily.  . rosuvastatin (CRESTOR) 10 MG tablet Take 1 tablet (10 mg total) by mouth daily.  . tamsulosin (FLOMAX) 0.4 MG CAPS capsule Take 0.4 mg by mouth at bedtime.      Allergies:   Penicillins   Social History   Socioeconomic History  . Marital status: Married    Spouse name: Not on file  . Number of children: Not on file  . Years of education: Not on file  . Highest education level: Not on file  Occupational History  . Occupation: retired  Tobacco Use  . Smoking status: Former Smoker    Packs/day: 1.00    Years: 12.00    Pack years: 12.00    Types: Cigarettes    Start date: 651954    Quit date: 01/30/1964    Years since quitting: 55.6  . Smokeless tobacco: Never Used  Substance and Sexual Activity  . Alcohol use: No  . Drug use: No  . Sexual activity: Not on file  Other Topics Concern  . Not on file  Social History Narrative  . Not on file   Social Determinants of Health   Financial Resource Strain:   . Difficulty of Paying Living Expenses: Not on file  Food Insecurity:   . Worried About  Programme researcher, broadcasting/film/videounning Out of Food in the Last Year: Not on file  . Ran Out of Food in the Last Year: Not on file  Transportation Needs:   . Lack of Transportation (Medical): Not on file  . Lack of Transportation (Non-Medical): Not on file  Physical Activity:   . Days of Exercise per Week: Not on file  . Minutes of Exercise per Session: Not on file  Stress:   . Feeling of Stress : Not on file  Social Connections:   . Frequency of Communication with Friends and Family: Not on file  . Frequency of Social Gatherings with Friends and Family: Not  on file  . Attends Religious Services: Not on file  . Active Member of Clubs or Organizations: Not on file  . Attends Banker Meetings: Not on file  . Marital Status: Not on file     Family History: The patient's family history includes Allergies in his brother, father, mother, and sister; Asthma in his brother; Cancer in his brother and sister; Heart disease in his brother, mother, and sister.  ROS:   Review of Systems  Constitution: Negative for decreased appetite, fever and weight gain.  HENT: Negative for congestion, ear discharge, hoarse voice and sore throat.   Eyes: Negative for discharge, redness, vision loss in right eye and visual halos.  Cardiovascular: Negative for chest pain, dyspnea on exertion, leg swelling, orthopnea and palpitations.  Respiratory: Negative for cough, hemoptysis, shortness of breath and snoring.   Endocrine: Negative for heat intolerance and polyphagia.  Hematologic/Lymphatic: Negative for bleeding problem. Does not bruise/bleed easily.  Skin: Negative for flushing, nail changes, rash and suspicious lesions.  Musculoskeletal: Negative for arthritis, joint pain, muscle cramps, myalgias, neck pain and stiffness.  Gastrointestinal: Negative for abdominal pain, bowel incontinence, diarrhea and excessive appetite.  Genitourinary: Negative for decreased libido, genital sores and incomplete emptying.  Neurological:  Negative for brief paralysis, focal weakness, headaches and loss of balance.  Psychiatric/Behavioral: Negative for altered mental status, depression and suicidal ideas.  Allergic/Immunologic: Negative for HIV exposure and persistent infections.    EKGs/Labs/Other Studies Reviewed:    The following studies were reviewed today:   EKG: None today  Recent LHC   Dist RCA lesion is 20% stenosed.  Ost Cx to Prox Cx lesion is 20% stenosed.  Prox LAD to Mid LAD lesion is 50% stenosed.   1. Moderate non-obstructive proximal to mid LAD stenosis 2. Mild plaque in the RCA and Circumflex   Recent Labs: 07/30/2019: TSH 3.420 08/20/2019: BUN 19; Creatinine, Ser 1.22; Hemoglobin 14.0; Platelets 142; Potassium 4.3; Sodium 140  Recent Lipid Panel No results found for: CHOL, TRIG, HDL, CHOLHDL, VLDL, LDLCALC, LDLDIRECT  Physical Exam:    VS:  BP 114/68 (BP Location: Left Arm, Patient Position: Sitting, Cuff Size: Normal)   Pulse (!) 58   Ht 5' 4.5" (1.638 m)   Wt 174 lb (78.9 kg)   SpO2 94%   BMI 29.41 kg/m     Wt Readings from Last 3 Encounters:  09/17/19 174 lb (78.9 kg)  08/31/19 171 lb (77.6 kg)  08/20/19 171 lb 6.4 oz (77.7 kg)     GEN: Well nourished, well developed in no acute distress HEENT: Normal NECK: No JVD; No carotid bruits LYMPHATICS: No lymphadenopathy CARDIAC: S1S2 noted,RRR, no murmurs, rubs, gallops RESPIRATORY:  Clear to auscultation without rales, wheezing or rhonchi  ABDOMEN: Soft, non-tender, non-distended, +bowel sounds, no guarding. EXTREMITIES: No edema, No cyanosis, no clubbing MUSCULOSKELETAL:  No deformity  SKIN: Warm and dry NEUROLOGIC:  Alert and oriented x 3, non-focal PSYCHIATRIC:  Normal affect, good insight  ASSESSMENT:    1. Dyslipidemia   2. Coronary artery disease involving native heart, angina presence unspecified, unspecified vessel or lesion type   3. Essential hypertension   4. Obstructive sleep apnea syndrome    PLAN:     1.  We  will continue patient on his aspirin 81 mg daily Crestor 10 mg daily for his coronary artery disease.  His symptoms are atypical for angina but I am going to give the patient as needed nitroglycerin to see if he gets some relief from  this if this does not give him some relief I will place him on long-acting nitrates.  2.  I think his fatigue is solely related to the fact that his sleep apnea is not well treated.  I do understand this has been a challenge for the patient and his sleep medicine doctor over the last several years.  But I am also concerned that if this is not well treated his cardiovascular risk for mortality is going to be increased over time.  3.  His heart rate has improved since been started on metoprolol succinate for his paroxysmal SVT.  But is not optimal.  He tells me about intermittent breakthroughs.  I have advised the patient that he is okay to take additional 12.5 mg in the morning if he experiences these palpitations that is intolerable.  He expresses understanding.  4.  Hyperlipidemia continue patient on the current dose of Crestor 10 mg daily.  5.  He complains of left-sided head pressure which he does not quite scribed as a headache.  He has been discussing this with her primary care doctor.  Based on characteristics and location of this I am suspecting this may be a tension headache which I have asked him to discuss more with his PCP and may need evaluation by a neurologist.  The patient is in agreement with the above plan. The patient left the office in stable condition.  The patient will follow up in 6 months or sooner if needed.   Medication Adjustments/Labs and Tests Ordered: Current medicines are reviewed at length with the patient today.  Concerns regarding medicines are outlined above.  No orders of the defined types were placed in this encounter.  Meds ordered this encounter  Medications  . nitroGLYCERIN (NITROSTAT) 0.4 MG SL tablet    Sig: Place 1 tablet  (0.4 mg total) under the tongue every 5 (five) minutes as needed.    Dispense:  25 tablet    Refill:  6    Patient Instructions  Medication Instructions:  Your physician has recommended you make the following change in your medication:   Take Nitroglycerin as needed for chest pain.  *If you need a refill on your cardiac medications before your next appointment, please call your pharmacy*   Lab Work: None ordered If you have labs (blood work) drawn today and your tests are completely normal, you will receive your results only by: Marland Kitchen MyChart Message (if you have MyChart) OR . A paper copy in the mail If you have any lab test that is abnormal or we need to change your treatment, we will call you to review the results.   Testing/Procedures: None ordered   Follow-Up: At [facility], you and your health needs are our priority.  As part of our continuing mission to provide you with exceptional heart care, we have created designated Provider Care Teams.  These Care Teams include your primary Cardiologist (physician) and Advanced Practice Providers (APPs -  Physician Assistants and Nurse Practitioners) who all work together to provide you with the care you need, when you need it.  We recommend signing up for the patient portal called "MyChart".  Sign up information is provided on this After Visit Summary.  MyChart is used to connect with patients for Virtual Visits (Telemedicine).  Patients are able to view lab/test results, encounter notes, upcoming appointments, etc.  Non-urgent messages can be sent to your provider as well.   To learn more about what you can do with MyChart, go  to [email or web address].    Your next appointment:   6 month(s)  The format for your next appointment:   In Person  Provider:   Thomasene Ripple, DO   Other Instructions Nitroglycerin sublingual tablets What is this medicine? NITROGLYCERIN (nye troe GLI ser in) is a type of vasodilator. It relaxes blood  vessels, increasing the blood and oxygen supply to your heart. This medicine is used to relieve chest pain caused by angina. It is also used to prevent chest pain before activities like climbing stairs, going outdoors in cold weather, or sexual activity. This medicine may be used for other purposes; ask your health care provider or pharmacist if you have questions. COMMON BRAND NAME(S): Nitroquick, Nitrostat, Nitrotab What should I tell my health care provider before I take this medicine? They need to know if you have any of these conditions:  anemia  head injury, recent stroke, or bleeding in the brain  liver disease  previous heart attack  an unusual or allergic reaction to nitroglycerin, other medicines, foods, dyes, or preservatives  pregnant or trying to get pregnant  breast-feeding How should I use this medicine? Take this medicine by mouth as needed. At the first sign of an angina attack (chest pain or tightness) place one tablet under your tongue. You can also take this medicine 5 to 10 minutes before an event likely to produce chest pain. Follow the directions on the prescription label. Let the tablet dissolve under the tongue. Do not swallow whole. Replace the dose if you accidentally swallow it. It will help if your mouth is not dry. Saliva around the tablet will help it to dissolve more quickly. Do not eat or drink, smoke or chew tobacco while a tablet is dissolving. If you are not better within 5 minutes after taking ONE dose of nitroglycerin, call 9-1-1 immediately to seek emergency medical care. Do not take more than 3 nitroglycerin tablets over 15 minutes. If you take this medicine often to relieve symptoms of angina, your doctor or health care professional may provide you with different instructions to manage your symptoms. If symptoms do not go away after following these instructions, it is important to call 9-1-1 immediately. Do not take more than 3 nitroglycerin tablets over  15 minutes. Talk to your pediatrician regarding the use of this medicine in children. Special care may be needed. Overdosage: If you think you have taken too much of this medicine contact a poison control center or emergency room at once. NOTE: This medicine is only for you. Do not share this medicine with others. What if I miss a dose? This does not apply. This medicine is only used as needed. What may interact with this medicine? Do not take this medicine with any of the following medications:  certain migraine medicines like ergotamine and dihydroergotamine (DHE)  medicines used to treat erectile dysfunction like sildenafil, tadalafil, and vardenafil  riociguat This medicine may also interact with the following medications:  alteplase  aspirin  heparin  medicines for high blood pressure  medicines for mental depression  other medicines used to treat angina  phenothiazines like chlorpromazine, mesoridazine, prochlorperazine, thioridazine This list may not describe all possible interactions. Give your health care provider a list of all the medicines, herbs, non-prescription drugs, or dietary supplements you use. Also tell them if you smoke, drink alcohol, or use illegal drugs. Some items may interact with your medicine. What should I watch for while using this medicine? Tell your doctor or health care professional if  you feel your medicine is no longer working. Keep this medicine with you at all times. Sit or lie down when you take your medicine to prevent falling if you feel dizzy or faint after using it. Try to remain calm. This will help you to feel better faster. If you feel dizzy, take several deep breaths and lie down with your feet propped up, or bend forward with your head resting between your knees. You may get drowsy or dizzy. Do not drive, use machinery, or do anything that needs mental alertness until you know how this drug affects you. Do not stand or sit up quickly,  especially if you are an older patient. This reduces the risk of dizzy or fainting spells. Alcohol can make you more drowsy and dizzy. Avoid alcoholic drinks. Do not treat yourself for coughs, colds, or pain while you are taking this medicine without asking your doctor or health care professional for advice. Some ingredients may increase your blood pressure. What side effects may I notice from receiving this medicine? Side effects that you should report to your doctor or health care professional as soon as possible:  blurred vision  dry mouth  skin rash  sweating  the feeling of extreme pressure in the head  unusually weak or tired Side effects that usually do not require medical attention (report to your doctor or health care professional if they continue or are bothersome):  flushing of the face or neck  headache  irregular heartbeat, palpitations  nausea, vomiting This list may not describe all possible side effects. Call your doctor for medical advice about side effects. You may report side effects to FDA at 1-800-FDA-1088. Where should I keep my medicine? Keep out of the reach of children. Store at room temperature between 20 and 25 degrees C (68 and 77 degrees F). Store in Retail buyer. Protect from light and moisture. Keep tightly closed. Throw away any unused medicine after the expiration date. NOTE: This sheet is a summary. It may not cover all possible information. If you have questions about this medicine, talk to your doctor, pharmacist, or health care provider.  2020 Elsevier/Gold Standard (2012-11-13 17:57:36)      Adopting a Healthy Lifestyle.  Know what a healthy weight is for you (roughly BMI <25) and aim to maintain this   Aim for 7+ servings of fruits and vegetables daily   65-80+ fluid ounces of water or unsweet tea for healthy kidneys   Limit to max 1 drink of alcohol per day; avoid smoking/tobacco   Limit animal fats in diet for cholesterol  and heart health - choose grass fed whenever available   Avoid highly processed foods, and foods high in saturated/trans fats   Aim for low stress - take time to unwind and care for your mental health   Aim for 150 min of moderate intensity exercise weekly for heart health, and weights twice weekly for bone health   Aim for 7-9 hours of sleep daily   When it comes to diets, agreement about the perfect plan isnt easy to find, even among the experts. Experts at the [facility] [employer] developed an idea known as the Healthy Eating Plate. Just imagine a plate divided into logical, healthy portions.   The emphasis is on diet quality:   Load up on vegetables and fruits - one-half of your plate: Aim for color and variety, and remember that potatoes dont count.   Go for whole grains - one-quarter of your plate:  Whole wheat, barley, wheat berries, quinoa, oats, brown rice, and foods made with them. If you want pasta, go with whole wheat pasta.   Protein power - one-quarter of your plate: Fish, chicken, beans, and nuts are all healthy, versatile protein sources. Limit red meat.   The diet, however, does go beyond the plate, offering a few other suggestions.   Use healthy plant oils, such as olive, canola, soy, corn, sunflower and peanut. Check the labels, and avoid partially hydrogenated oil, which have unhealthy trans fats.   If youre thirsty, drink water. Coffee and tea are good in moderation, but skip sugary drinks and limit milk and dairy products to one or two daily servings.   The type of carbohydrate in the diet is more important than the amount. Some sources of carbohydrates, such as vegetables, fruits, whole grains, and beans-are healthier than others.   Finally, stay active  Signed, Thomasene Ripple, DO  09/17/2019 12:05 PM    [facility]

## 2019-09-17 NOTE — Patient Instructions (Addendum)
Medication Instructions:  Your physician has recommended you make the following change in your medication:   Take Nitroglycerin as needed for chest pain.  *If you need a refill on your cardiac medications before your next appointment, please call your pharmacy*   Lab Work: None ordered If you have labs (blood work) drawn today and your tests are completely normal, you will receive your results only by: Marland Kitchen MyChart Message (if you have MyChart) OR . A paper copy in the mail If you have any lab test that is abnormal or we need to change your treatment, we will call you to review the results.   Testing/Procedures: None ordered   Follow-Up: At [facility], you and your health needs are our priority.  As part of our continuing mission to provide you with exceptional heart care, we have created designated Provider Care Teams.  These Care Teams include your primary Cardiologist (physician) and Advanced Practice Providers (APPs -  Physician Assistants and Nurse Practitioners) who all work together to provide you with the care you need, when you need it.  We recommend signing up for the patient portal called "MyChart".  Sign up information is provided on this After Visit Summary.  MyChart is used to connect with patients for Virtual Visits (Telemedicine).  Patients are able to view lab/test results, encounter notes, upcoming appointments, etc.  Non-urgent messages can be sent to your provider as well.   To learn more about what you can do with MyChart, go to [email or web address].    Your next appointment:   6 month(s)  The format for your next appointment:   In Person  Provider:   Thomasene Ripple, DO   Other Instructions Nitroglycerin sublingual tablets What is this medicine? NITROGLYCERIN (nye troe GLI ser in) is a type of vasodilator. It relaxes blood vessels, increasing the blood and oxygen supply to your heart. This medicine is used to relieve chest pain caused by angina. It is also  used to prevent chest pain before activities like climbing stairs, going outdoors in cold weather, or sexual activity. This medicine may be used for other purposes; ask your health care provider or pharmacist if you have questions. COMMON BRAND NAME(S): Nitroquick, Nitrostat, Nitrotab What should I tell my health care provider before I take this medicine? They need to know if you have any of these conditions:  anemia  head injury, recent stroke, or bleeding in the brain  liver disease  previous heart attack  an unusual or allergic reaction to nitroglycerin, other medicines, foods, dyes, or preservatives  pregnant or trying to get pregnant  breast-feeding How should I use this medicine? Take this medicine by mouth as needed. At the first sign of an angina attack (chest pain or tightness) place one tablet under your tongue. You can also take this medicine 5 to 10 minutes before an event likely to produce chest pain. Follow the directions on the prescription label. Let the tablet dissolve under the tongue. Do not swallow whole. Replace the dose if you accidentally swallow it. It will help if your mouth is not dry. Saliva around the tablet will help it to dissolve more quickly. Do not eat or drink, smoke or chew tobacco while a tablet is dissolving. If you are not better within 5 minutes after taking ONE dose of nitroglycerin, call 9-1-1 immediately to seek emergency medical care. Do not take more than 3 nitroglycerin tablets over 15 minutes. If you take this medicine often to relieve symptoms of angina, your doctor  or health care professional may provide you with different instructions to manage your symptoms. If symptoms do not go away after following these instructions, it is important to call 9-1-1 immediately. Do not take more than 3 nitroglycerin tablets over 15 minutes. Talk to your pediatrician regarding the use of this medicine in children. Special care may be needed. Overdosage: If you  think you have taken too much of this medicine contact a poison control center or emergency room at once. NOTE: This medicine is only for you. Do not share this medicine with others. What if I miss a dose? This does not apply. This medicine is only used as needed. What may interact with this medicine? Do not take this medicine with any of the following medications:  certain migraine medicines like ergotamine and dihydroergotamine (DHE)  medicines used to treat erectile dysfunction like sildenafil, tadalafil, and vardenafil  riociguat This medicine may also interact with the following medications:  alteplase  aspirin  heparin  medicines for high blood pressure  medicines for mental depression  other medicines used to treat angina  phenothiazines like chlorpromazine, mesoridazine, prochlorperazine, thioridazine This list may not describe all possible interactions. Give your health care provider a list of all the medicines, herbs, non-prescription drugs, or dietary supplements you use. Also tell them if you smoke, drink alcohol, or use illegal drugs. Some items may interact with your medicine. What should I watch for while using this medicine? Tell your doctor or health care professional if you feel your medicine is no longer working. Keep this medicine with you at all times. Sit or lie down when you take your medicine to prevent falling if you feel dizzy or faint after using it. Try to remain calm. This will help you to feel better faster. If you feel dizzy, take several deep breaths and lie down with your feet propped up, or bend forward with your head resting between your knees. You may get drowsy or dizzy. Do not drive, use machinery, or do anything that needs mental alertness until you know how this drug affects you. Do not stand or sit up quickly, especially if you are an older patient. This reduces the risk of dizzy or fainting spells. Alcohol can make you more drowsy and dizzy.  Avoid alcoholic drinks. Do not treat yourself for coughs, colds, or pain while you are taking this medicine without asking your doctor or health care professional for advice. Some ingredients may increase your blood pressure. What side effects may I notice from receiving this medicine? Side effects that you should report to your doctor or health care professional as soon as possible:  blurred vision  dry mouth  skin rash  sweating  the feeling of extreme pressure in the head  unusually weak or tired Side effects that usually do not require medical attention (report to your doctor or health care professional if they continue or are bothersome):  flushing of the face or neck  headache  irregular heartbeat, palpitations  nausea, vomiting This list may not describe all possible side effects. Call your doctor for medical advice about side effects. You may report side effects to FDA at 1-800-FDA-1088. Where should I keep my medicine? Keep out of the reach of children. Store at room temperature between 20 and 25 degrees C (68 and 77 degrees F). Store in [facility] [employer]. Protect from light and moisture. Keep tightly closed. Throw away any unused medicine after the expiration date. NOTE: This sheet is a summary. It may not  cover all possible information. If you have questions about this medicine, talk to your doctor, pharmacist, or health care provider.  2020 Elsevier/Gold Standard (2012-11-13 17:57:36)   

## 2019-09-21 ENCOUNTER — Other Ambulatory Visit: Payer: Self-pay

## 2019-09-21 ENCOUNTER — Ambulatory Visit: Payer: Medicare HMO | Admitting: Pulmonary Disease

## 2019-09-21 ENCOUNTER — Encounter: Payer: Self-pay | Admitting: Pulmonary Disease

## 2019-09-21 VITALS — BP 142/72 | HR 66 | Temp 97.7°F | Ht 64.0 in | Wt 175.0 lb

## 2019-09-21 DIAGNOSIS — G4730 Sleep apnea, unspecified: Secondary | ICD-10-CM | POA: Diagnosis not present

## 2019-09-21 DIAGNOSIS — G4722 Circadian rhythm sleep disorder, advanced sleep phase type: Secondary | ICD-10-CM | POA: Diagnosis not present

## 2019-09-21 DIAGNOSIS — G4700 Insomnia, unspecified: Secondary | ICD-10-CM

## 2019-09-21 DIAGNOSIS — Z9989 Dependence on other enabling machines and devices: Secondary | ICD-10-CM

## 2019-09-21 DIAGNOSIS — G4733 Obstructive sleep apnea (adult) (pediatric): Secondary | ICD-10-CM | POA: Diagnosis not present

## 2019-09-21 NOTE — Patient Instructions (Signed)
Will arrange for overnight oxygen test while using CPAP  Try restricting your sleep time to between 12 am and 6 am  Follow up in 6 to 8 weeks

## 2019-09-21 NOTE — Progress Notes (Signed)
[facility]  Chief Complaint  Patient presents with  . Follow-up    Constitutional:  BP (!) 142/72 (BP Location: Left Arm, Cuff Size: Normal)   Pulse 66   Temp 97.7 F (36.5 C)   Ht 5\' 4"  (1.626 m)   Wt 175 lb (79.4 kg)   SpO2 98%   BMI 30.04 kg/m   Past Medical History:  Varicose veins, GERD, Neuropathy, HLD, BPH, non obstructive CAD  Brief Summary:  [name] [name] is a 79 y.o. male with obstructive sleep apnea.  Subjective:   He continues to have trouble with his sleep.  He was walking up with heart palpitations.  He was seen by cardiology and had testing done.  He was started on toprol xl and this has helped some, but still has episodes.    He wasn't able to afford sublingual zolpidem and never used this.  He feels like he needs 6 to 7 hours sleep per night, and ideal time to wake up would be at 6 am.  However, he goes to bed at 1020 and then ends up being awake by 330 to 4 am.  He then tries to fall back to sleep, but can't.  He uses CPAP nightly.  No issues with mask fit.  Physical Exam:   Appearance - well kempt   ENMT - no sinus tenderness, no oral exudate, no LAN, Mallampati 3 airway, no stridor  Respiratory - equal breath sounds bilaterally, no wheezing or rales  CV - s1s2 regular rate and rhythm, no murmurs  Ext - no clubbing, no edema  Skin - no rashes  Psych - normal mood and affect   Assessment/Plan:   Obstructive sleep apnea - he is compliant with therapy - continue CPAP 14 cm H2O - will arrange for overnight oximetry with CPAP; if this shows oxygen desaturation, he would then need a titration study  Advanced sleep phase. - advised him to shift his bed time to 12 am to 6 am, and not sleep outside this time period  Sleep maintenance insomnia. - might actually be related to advanced sleep phase - defer sleep aide medication at this time - if ONO on CPAP is okay and his adherent to regular sleep  pattern and symptoms persists could then consider adding a medication to help his sleep  A total of 38 minutes spent addressing patient care issues on day of visit.   Follow up:   Patient Instructions  Will arrange for overnight oxygen test while using CPAP  Try restricting your sleep time to between 12 am and 6 am  Follow up in 6 to 8 weeks   Signature:  94, MD [facility] Pager: [phone] 09/21/2019, 1:38 PM  Flow Sheet    Sleep tests:   PSG 04/21/03 >> AHI 54  CPAP 08/22/19 to 09/20/19 >> used on 30 of 30 nights with average 7 hrs 14 min.  Average AHI 5.8 with CPAP 14 cm H2O  Cardiac tests:   Echo 08/18/19 >> EF 60 to 65%, grade 1 DD  Medications:   Allergies as of 09/21/2019      Reactions   Penicillins Other (See Comments)   Does not work for pt (ineffective)      Medication List       Accurate as of September 21, 2019  1:38 PM. If you have any questions, ask your nurse or doctor.        aspirin EC 81 MG tablet  Take 1 tablet (81 mg total) by mouth daily. Swallow whole.   cholecalciferol 25 MCG (1000 UNIT) tablet Commonly known as: VITAMIN D Take 1,000 Units by mouth daily.   fluticasone 50 MCG/ACT nasal spray Commonly known as: FLONASE Place 1-2 sprays into both nostrils daily as needed for allergies.   KONSYL FIBER PO Take 1 Dose by mouth daily. 1 teaspoon   metoprolol succinate 25 MG 24 hr tablet Commonly known as: Toprol XL Take 0.5 tablets (12.5 mg total) by mouth daily.   naproxen sodium 220 MG tablet Commonly known as: ALEVE Take 220 mg by mouth 2 (two) times daily as needed (back pain.).   nitroGLYCERIN 0.4 MG SL tablet Commonly known as: NITROSTAT Place 1 tablet (0.4 mg total) under the tongue every 5 (five) minutes as needed.   pantoprazole 40 MG tablet Commonly known as: PROTONIX Take 1 tablet (40 mg total) by mouth daily.   rosuvastatin 10 MG tablet Commonly known as: CRESTOR Take 1 tablet (10 mg  total) by mouth daily.   tamsulosin 0.4 MG Caps capsule Commonly known as: FLOMAX Take 0.4 mg by mouth at bedtime.       Past Surgical History:  He  has a past surgical history that includes Appendectomy (2751); Spine surgery; Nasal sinus surgery; Colonoscopy (02/10/2013); and LEFT HEART CATH AND CORONARY ANGIOGRAPHY (N/A, 08/31/2019).  Family History:  His family history includes Allergies in his brother, father, mother, and sister; Asthma in his brother; Cancer in his brother and sister; Heart disease in his brother, mother, and sister.  Social History:  He  reports that he quit smoking about 55 years ago. His smoking use included cigarettes. He started smoking about 67 years ago. He has a 12.00 pack-year smoking history. He has never used smokeless tobacco. He reports that he does not drink alcohol and does not use drugs.

## 2019-09-30 ENCOUNTER — Ambulatory Visit: Payer: Medicare HMO | Admitting: Cardiology

## 2019-10-09 DIAGNOSIS — R0683 Snoring: Secondary | ICD-10-CM | POA: Diagnosis not present

## 2019-10-09 DIAGNOSIS — G4730 Sleep apnea, unspecified: Secondary | ICD-10-CM | POA: Diagnosis not present

## 2019-10-27 DIAGNOSIS — N138 Other obstructive and reflux uropathy: Secondary | ICD-10-CM | POA: Diagnosis not present

## 2019-10-27 DIAGNOSIS — N401 Enlarged prostate with lower urinary tract symptoms: Secondary | ICD-10-CM | POA: Diagnosis not present

## 2019-11-02 ENCOUNTER — Telehealth: Payer: Self-pay | Admitting: Pulmonary Disease

## 2019-11-02 DIAGNOSIS — G4733 Obstructive sleep apnea (adult) (pediatric): Secondary | ICD-10-CM

## 2019-11-02 NOTE — Telephone Encounter (Signed)
ONO with CPAP 10/09/19 >> test time 7 hrs 48 min.  Baseline SpO2 95%.  Low SpO2 80%.  Spent 3 min 8 sec with SpO2 < 88%.   Please let him know that his oxygen test looked okay regarding his oxygen level at night while using CPAP.  It did show that he needs an increase in his CPAP setting.  Please send order to his DME to have his CPAP setting increased to 15 cm H2O.

## 2019-11-03 NOTE — Telephone Encounter (Signed)
Called and spoke with patient about ONO results per Dr Craige Cotta. Order placed to increase CPAP setting to 15 cm H20 per Dr Craige Cotta. Patient expressed full understanding of result and of order being placed. Confirmed office visit scheduled for 11/23/2019

## 2019-11-03 NOTE — Addendum Note (Signed)
Addended by: Melonie Florida on: 11/03/2019 03:05 PM   Modules accepted: Orders

## 2019-11-17 ENCOUNTER — Telehealth: Payer: Self-pay | Admitting: *Deleted

## 2019-11-17 MED ORDER — ROSUVASTATIN CALCIUM 10 MG PO TABS: 10.0000 mg | ORAL_TABLET | Freq: Every day | ORAL | 5 refills | Status: DC

## 2019-11-17 NOTE — Telephone Encounter (Signed)
Rx refill sent to pharmacy. 

## 2019-11-23 ENCOUNTER — Other Ambulatory Visit: Payer: Self-pay

## 2019-11-23 ENCOUNTER — Ambulatory Visit: Payer: Medicare HMO | Admitting: Pulmonary Disease

## 2019-11-23 ENCOUNTER — Encounter: Payer: Self-pay | Admitting: Pulmonary Disease

## 2019-11-23 VITALS — BP 132/70 | HR 64 | Temp 96.7°F | Ht 64.0 in | Wt 172.0 lb

## 2019-11-23 DIAGNOSIS — Z9989 Dependence on other enabling machines and devices: Secondary | ICD-10-CM

## 2019-11-23 DIAGNOSIS — G4733 Obstructive sleep apnea (adult) (pediatric): Secondary | ICD-10-CM | POA: Diagnosis not present

## 2019-11-23 DIAGNOSIS — G4722 Circadian rhythm sleep disorder, advanced sleep phase type: Secondary | ICD-10-CM | POA: Diagnosis not present

## 2019-11-23 MED ORDER — PANTOPRAZOLE SODIUM 40 MG PO TBEC: 40.0000 mg | DELAYED_RELEASE_TABLET | Freq: Every day | ORAL | 2 refills | Status: AC

## 2019-11-23 NOTE — Telephone Encounter (Signed)
Refill of Pantoprazole 40 mg sent to [facility] on [address].

## 2019-11-23 NOTE — Patient Instructions (Signed)
Follow up in 1 year.

## 2019-11-23 NOTE — Progress Notes (Signed)
[name] [name], [name] [name], [name] [name]  Chief Complaint  Patient presents with  . Follow-up    Constitutional:  BP 132/70   Pulse 64   Temp (!) 96.7 F (35.9 C)   Ht 5\' 4"  (1.626 m)   Wt 172 lb (78 kg)   SpO2 98% Comment: ra  BMI 29.52 kg/m   Past Medical History:  Varicose veins, GERD, Neuropathy, HLD, BPH, non obstructive CAD  Past Surgical History:  His  has a past surgical history that includes Appendectomy ); Spine surgery; Nasal sinus surgery; Colonoscopy (02/10/2013); [name] LEFT HEART CATH [name] CORONARY ANGIOGRAPHY (N/A, 08/31/2019).  Brief Summary:  [name] [name] is a 79 y.o. male with obstructive sleep apnea.      Subjective:   He did ONO with CPAP.  Showed SpO2 okay, but needed increase in CPAP.  This was changed from 14 to 15 cm H2O.  He hasn't noticed any difference.  He goes to bed around 10 pm.  No problem falling asleep.  Stays asleep until about 4 am, [name] then wakes up.  Stays in bed until about 6 am so as not to disturb his wife.  Does okay during the day when he stays active, but sometimes needs to nap when he sits quiet.  Physical Exam:   Appearance - well kempt   ENMT - no sinus tenderness, no oral exudate, no LAN, Mallampati 3 airway, no stridor  Respiratory - equal breath sounds bilaterally, no wheezing or rales  CV - s1s2 regular rate [name] rhythm, no murmurs  Ext - no clubbing, no edema  Skin - no rashes  Psych - normal mood [name] affect   Sleep Tests:   PSG 04/21/03 >> AHI 54  ONO with CPAP 10/09/19 >> test time 7 hrs 48 min.  Baseline SpO2 95%.  Low SpO2 80%.  Spent 3 min 8 sec with SpO2 < 88%.  CPAP 10/24/19 to 11/22/19 >> used on 30 of 30 nights with average 7 hrs 22 min.  Average AHI 8.9 with CPAP 15 cm H2O  Cardiac Tests:   Echo 08/18/19 >> EF 60 to 65%, grade 1 DD  Social History:  He  reports that he quit smoking about 55 years ago. His smoking use included cigarettes. He started smoking about 67 years ago. He  has a 12.00 pack-year smoking history. He has never used smokeless tobacco. He reports that he does not drink alcohol [name] does not use drugs.  Family History:  His family history includes Allergies in his brother, father, mother, [name] sister; Asthma in his brother; Cancer in his brother [name] sister; Heart disease in his brother, mother, [name] sister.     Assessment/Plan:   Obstructive sleep apnea - he is compliant with CPAP [name] reports benefit from therapy - uses [facility] for his DME - he should be eligible for a new machine, but prefers to keep his current device for now - continue CPAP 15 cm H2O  Advanced sleep phase. - he was not able to tolerate phase shifting techniques - current sleep pattern is 10 pm to 4 am   Time Spent Involved in Patient [name] on Day of Examination:  23 minutes  Follow up:  Patient Instructions  Follow up in 1 year   Medication List:   Allergies as of 11/23/2019      Reactions   Penicillins Other (See Comments)   Does not work for pt (ineffective)      Medication List  Accurate as of November 23, 2019 10:56 AM. If you have any questions, ask your nurse or doctor.        aspirin EC 81 MG tablet Take 1 tablet (81 mg total) by mouth daily. Swallow whole.   cholecalciferol 25 MCG (1000 UNIT) tablet Commonly known as: VITAMIN D Take 1,000 Units by mouth daily.   fluticasone 50 MCG/ACT nasal spray Commonly known as: FLONASE Place 1-2 sprays into both nostrils daily as needed for allergies.   KONSYL FIBER PO Take 1 Dose by mouth daily. 1 teaspoon   metoprolol succinate 25 MG 24 hr tablet Commonly known as: Toprol XL Take 0.5 tablets (12.5 mg total) by mouth daily.   naproxen sodium 220 MG tablet Commonly known as: ALEVE Take 220 mg by mouth 2 (two) times daily as needed (back pain.).   nitroGLYCERIN 0.4 MG SL tablet Commonly known as: NITROSTAT Place 1 tablet (0.4 mg total) under the tongue every 5 (five) minutes as needed.    pantoprazole 40 MG tablet Commonly known as: PROTONIX Take 1 tablet (40 mg total) by mouth daily.   rosuvastatin 10 MG tablet Commonly known as: CRESTOR Take 1 tablet (10 mg total) by mouth daily.   tamsulosin 0.4 MG Caps capsule Commonly known as: FLOMAX Take 0.4 mg by mouth at bedtime.       Signature:  Coralyn Helling, MD [name] Pager - [phone] 11/23/2019, 10:56 AM

## 2019-12-07 ENCOUNTER — Telehealth: Payer: Self-pay | Admitting: Pulmonary Disease

## 2019-12-07 DIAGNOSIS — G4733 Obstructive sleep apnea (adult) (pediatric): Secondary | ICD-10-CM

## 2019-12-07 DIAGNOSIS — Z9989 Dependence on other enabling machines and devices: Secondary | ICD-10-CM

## 2019-12-07 NOTE — Telephone Encounter (Signed)
Patient is aware of below message and voiced her understanding.  Order has been placed to [facility] for new machine.  appt scheduled for 03/19/2019 at 10:30. Nothing further needed.

## 2019-12-07 NOTE — Telephone Encounter (Signed)
Please send order to arrange for new CPAP at 15 cm H2O with heated humidity.  He will need ROV in 3 months instead of 1 year since he is getting a new CPAP machine.

## 2019-12-07 NOTE — Telephone Encounter (Signed)
Called and spoke to patient, who is requesting order for new cpap machine to be sent to [facility].  Patient stated that this was discussed at last OV and at that time he wanted to continue with his current machine. He now would like to proceed with new machine.  Dr. Craige Cotta, please advise. Thanks

## 2019-12-10 ENCOUNTER — Other Ambulatory Visit: Payer: Self-pay | Admitting: Cardiology

## 2020-02-24 DIAGNOSIS — I25119 Atherosclerotic heart disease of native coronary artery with unspecified angina pectoris: Secondary | ICD-10-CM | POA: Diagnosis not present

## 2020-02-24 DIAGNOSIS — N1831 Chronic kidney disease, stage 3a: Secondary | ICD-10-CM | POA: Diagnosis not present

## 2020-02-24 DIAGNOSIS — E538 Deficiency of other specified B group vitamins: Secondary | ICD-10-CM | POA: Diagnosis not present

## 2020-02-24 DIAGNOSIS — Z6827 Body mass index (BMI) 27.0-27.9, adult: Secondary | ICD-10-CM | POA: Diagnosis not present

## 2020-02-24 DIAGNOSIS — E785 Hyperlipidemia, unspecified: Secondary | ICD-10-CM | POA: Diagnosis not present

## 2020-02-24 DIAGNOSIS — E663 Overweight: Secondary | ICD-10-CM | POA: Diagnosis not present

## 2020-02-24 DIAGNOSIS — G720 Drug-induced myopathy: Secondary | ICD-10-CM | POA: Diagnosis not present

## 2020-02-24 DIAGNOSIS — J410 Simple chronic bronchitis: Secondary | ICD-10-CM | POA: Diagnosis not present

## 2020-02-27 DIAGNOSIS — G4733 Obstructive sleep apnea (adult) (pediatric): Secondary | ICD-10-CM | POA: Diagnosis not present

## 2020-03-14 DIAGNOSIS — E663 Overweight: Secondary | ICD-10-CM | POA: Diagnosis not present

## 2020-03-14 DIAGNOSIS — K529 Noninfective gastroenteritis and colitis, unspecified: Secondary | ICD-10-CM | POA: Diagnosis not present

## 2020-03-15 DIAGNOSIS — K529 Noninfective gastroenteritis and colitis, unspecified: Secondary | ICD-10-CM | POA: Diagnosis not present

## 2020-03-18 ENCOUNTER — Ambulatory Visit: Payer: Medicare HMO | Admitting: Pulmonary Disease

## 2020-03-21 DIAGNOSIS — E663 Overweight: Secondary | ICD-10-CM | POA: Insufficient documentation

## 2020-03-21 DIAGNOSIS — G4730 Sleep apnea, unspecified: Secondary | ICD-10-CM | POA: Insufficient documentation

## 2020-03-21 DIAGNOSIS — R300 Dysuria: Secondary | ICD-10-CM | POA: Insufficient documentation

## 2020-03-21 DIAGNOSIS — K649 Unspecified hemorrhoids: Secondary | ICD-10-CM | POA: Insufficient documentation

## 2020-03-21 DIAGNOSIS — K219 Gastro-esophageal reflux disease without esophagitis: Secondary | ICD-10-CM | POA: Insufficient documentation

## 2020-03-21 DIAGNOSIS — H905 Unspecified sensorineural hearing loss: Secondary | ICD-10-CM | POA: Insufficient documentation

## 2020-03-21 DIAGNOSIS — E785 Hyperlipidemia, unspecified: Secondary | ICD-10-CM | POA: Insufficient documentation

## 2020-03-21 DIAGNOSIS — R0602 Shortness of breath: Secondary | ICD-10-CM | POA: Insufficient documentation

## 2020-03-21 DIAGNOSIS — J449 Chronic obstructive pulmonary disease, unspecified: Secondary | ICD-10-CM | POA: Insufficient documentation

## 2020-03-21 DIAGNOSIS — G4733 Obstructive sleep apnea (adult) (pediatric): Secondary | ICD-10-CM | POA: Insufficient documentation

## 2020-03-21 DIAGNOSIS — G629 Polyneuropathy, unspecified: Secondary | ICD-10-CM | POA: Insufficient documentation

## 2020-03-21 DIAGNOSIS — J22 Unspecified acute lower respiratory infection: Secondary | ICD-10-CM | POA: Insufficient documentation

## 2020-03-21 DIAGNOSIS — J329 Chronic sinusitis, unspecified: Secondary | ICD-10-CM | POA: Insufficient documentation

## 2020-03-21 DIAGNOSIS — R972 Elevated prostate specific antigen [PSA]: Secondary | ICD-10-CM | POA: Insufficient documentation

## 2020-03-21 DIAGNOSIS — K2100 Gastro-esophageal reflux disease with esophagitis, without bleeding: Secondary | ICD-10-CM | POA: Insufficient documentation

## 2020-03-21 DIAGNOSIS — M5126 Other intervertebral disc displacement, lumbar region: Secondary | ICD-10-CM | POA: Insufficient documentation

## 2020-03-21 DIAGNOSIS — K648 Other hemorrhoids: Secondary | ICD-10-CM | POA: Insufficient documentation

## 2020-03-21 DIAGNOSIS — N183 Chronic kidney disease, stage 3 unspecified: Secondary | ICD-10-CM | POA: Insufficient documentation

## 2020-03-21 DIAGNOSIS — M545 Low back pain, unspecified: Secondary | ICD-10-CM | POA: Insufficient documentation

## 2020-03-21 DIAGNOSIS — N411 Chronic prostatitis: Secondary | ICD-10-CM | POA: Insufficient documentation

## 2020-03-21 DIAGNOSIS — J343 Hypertrophy of nasal turbinates: Secondary | ICD-10-CM | POA: Insufficient documentation

## 2020-03-21 DIAGNOSIS — I83892 Varicose veins of left lower extremities with other complications: Secondary | ICD-10-CM | POA: Insufficient documentation

## 2020-03-21 DIAGNOSIS — J302 Other seasonal allergic rhinitis: Secondary | ICD-10-CM | POA: Insufficient documentation

## 2020-03-22 ENCOUNTER — Other Ambulatory Visit: Payer: Self-pay

## 2020-03-22 ENCOUNTER — Ambulatory Visit: Payer: Medicare HMO | Admitting: Cardiology

## 2020-03-22 ENCOUNTER — Encounter: Payer: Self-pay | Admitting: Cardiology

## 2020-03-22 VITALS — BP 118/62 | HR 60 | Ht 64.0 in | Wt 171.4 lb

## 2020-03-22 DIAGNOSIS — I2510 Atherosclerotic heart disease of native coronary artery without angina pectoris: Secondary | ICD-10-CM

## 2020-03-22 DIAGNOSIS — I491 Atrial premature depolarization: Secondary | ICD-10-CM

## 2020-03-22 DIAGNOSIS — G4733 Obstructive sleep apnea (adult) (pediatric): Secondary | ICD-10-CM

## 2020-03-22 DIAGNOSIS — I10 Essential (primary) hypertension: Secondary | ICD-10-CM

## 2020-03-22 DIAGNOSIS — I471 Supraventricular tachycardia: Secondary | ICD-10-CM | POA: Diagnosis not present

## 2020-03-22 NOTE — Patient Instructions (Signed)

## 2020-03-22 NOTE — Progress Notes (Signed)
Cardiology Office Note:    Date:  03/22/2020   ID:  [name] [name], DOB Dec 06, 1940, MRN [id number]  PCP:  Street, [name], MD  Cardiologist:  Thomasene Ripple, DO  Electrophysiologist:  None   Referring MD: Street, [name], *   I am doing fine  History of Present Illness:    [name] [name] is a 80 y.o. male with a hx of of coronary artery disease based on recent heart catheterization showed moderate stenosis, COPD, BPH, hyperlipidemia, symptomatic PACs and sleep apnea on CPAP however patient tells me has been a challenge for managing his CPAP.  The patient presents today for follow-up visit.  Last saw the patient August 31, 2019 at that time we discussed his test results which included nuclear stress test which was abnormal, his echocardiogram results as well as a ZIO monitor which show symptomatic paroxysmal supraventricular tachycardia and rare symptomatic PACs.  In the interim the patient did go for left heart catheterization at which time there was a nonobstructive mid LAD lesion which was 50% with no need for any intervention, RCA distal 20% stenosis as well as left circumflex artery with 20% lesion.  I last saw the patient on September 17, 2019  We also discussed that his fatigue may be from his sleep apnea and he was planning to follow-up with his sleep medicine doctor to get a new CPAP.  In the interim he was able to connect with his sleep medicine doctor and he has had a change of his CPAP.  The long-acting nitro has helped he has no chest pain.   Past Medical History:  Diagnosis Date  . Acute prostatitis 12/19/2015  . Acute respiratory infection   . Allergic rhinitis, seasonal   . Apnea, sleep 09/18/2007   NPSG 2005:  AHI 54/hr New machine 2014 Auto 2014:  Optimal pressure 20cm, but still with some leak and breakthru events.  Download 2015:  Pressure 18, no significant leaks, AHI 12/hr.   . 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 Benign prostatic hyperplasia with lower  urinary tract symptoms 12/19/2015  . Breath shortness 07/29/2019  . Central hearing loss   . Chest pain   . Chronic infection of sinus 07/29/2019  . Chronic kidney disease, stage 3 (HCC)   . Chronic obstructive pulmonary disease (COPD) (HCC)   . Chronic prostatitis   . Chronic sinusitis   . 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 DOE (dyspnea on exertion) 07/22/2012  . Dyslipidemia   . Dysuria   . Elevated PSA   . Elevated PSA, less than 10 ng/ml   . Encounter for screening for cardiovascular disorders 07/29/2019  . Enlarged prostate   . Esophageal reflux   . Esophagitis, reflux 07/29/2019  . Family history of prostate cancer 10/22/2018  . Flu vaccine need 07/29/2019  . GERD (gastroesophageal reflux disease)   . Hemorrhoids   . Herniated nucleus pulposus, L3-4 left   . HLD (hyperlipidemia) 07/29/2019  . Hyperlipidemia   . Hypertrophy of nasal turbinates 07/29/2019  . Hypochromic microcytic anemia   . Internal and external prolapsed hemorrhoids   . LBP (low back pain) 07/29/2019  . 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 Lower back pain   . Lumbar neuritis   . Nasal turbinate hypertrophy   . Obstructive sleep apnea 09/18/2007   NPSG 2005:  AHI 54/hr New machine 2014 Auto 2014:  Optimal pressure 20cm, but still with some leak and breakthru events.  Download 2015:  Pressure 18, no  significant leaks, AHI 12/hr.    . OSA (obstructive sleep apnea)    severe  . Other long term (current) drug therapy 07/29/2019  . Overweight   . Pain of left hand 09/09/2017  . 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 Peripheral neuropathy   . Radiculopathy of lumbar region 07/29/2019  . Reflux esophagitis   . Seasonal allergic rhinitis   . Shortness of breath   . Sleep apnea   . 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 APPENDECTOMY  1957  . COLONOSCOPY  02/10/2013   Tubular Adenoma  . LEFT HEART CATH AND  CORONARY ANGIOGRAPHY N/A 08/31/2019   Procedure: LEFT HEART CATH AND CORONARY ANGIOGRAPHY;  Surgeon: Kathleene Hazel, MD;  Location: [facility];  Service: Cardiovascular;  Laterality: N/A;  . NASAL SINUS SURGERY    . SPINE SURGERY      Current Medications: Current Meds  Medication Sig  . aspirin EC 81 MG tablet Take 1 tablet (81 mg total) by mouth daily. Swallow whole.  . Calcium Polycarbophil (KONSYL FIBER PO) Take 1 Dose by mouth daily. 1 teaspoon  . cholecalciferol (VITAMIN D) 25 MCG (1000 UNIT) tablet Take 1,000 Units by mouth daily.  . fluticasone (FLONASE) 50 MCG/ACT nasal spray Place 1-2 sprays into both nostrils daily as needed for allergies.   . metoprolol succinate (TOPROL-XL) 25 MG 24 hr tablet Take 1/2 (one-half) tablet by mouth once daily  . naproxen sodium (ALEVE) 220 MG tablet Take 220 mg by mouth 2 (two) times daily as needed (back pain.).   Marland Kitchen pantoprazole (PROTONIX) 40 MG tablet Take 1 tablet (40 mg total) by mouth daily.  . tamsulosin (FLOMAX) 0.4 MG CAPS capsule Take 0.4 mg by mouth at bedtime.      Allergies:   Penicillins   Social History   Socioeconomic History  . Marital status: Married    Spouse name: Not on file  . Number of children: Not on file  . Years of education: Not on file  . Highest education level: Not on file  Occupational History  . Occupation: retired  Tobacco Use  . Smoking status: Former Smoker    Packs/day: 1.00    Years: 12.00    Pack years: 12.00    Types: Cigarettes    Start date: 62    Quit date: 01/30/1964    Years since quitting: 56.1  . Smokeless tobacco: Never Used  Substance and Sexual Activity  . Alcohol use: No  . Drug use: No  . Sexual activity: Not on file  Other Topics Concern  . Not on file  Social History Narrative  . Not on file   Social Determinants of Health   Financial Resource Strain: Not on file  Food Insecurity: Not on file  Transportation Needs: Not on file  Physical Activity: Not on  file  Stress: Not on file  Social Connections: Not on file     Family History: The patient's family history includes Allergies in his brother, father, mother, and sister; Asthma in his brother; Cancer in his brother and sister; Heart disease in his brother, mother, and sister.  ROS:   Review of Systems  Constitution: Negative for decreased appetite, fever and weight gain.  HENT: Negative for congestion, ear discharge, hoarse voice and sore throat.   Eyes: Negative for discharge, redness, vision loss in right eye and visual halos.  Cardiovascular: Negative for chest pain,  dyspnea on exertion, leg swelling, orthopnea and palpitations.  Respiratory: Negative for cough, hemoptysis, shortness of breath and snoring.   Endocrine: Negative for heat intolerance and polyphagia.  Hematologic/Lymphatic: Negative for bleeding problem. Does not bruise/bleed easily.  Skin: Negative for flushing, nail changes, rash and suspicious lesions.  Musculoskeletal: Negative for arthritis, joint pain, muscle cramps, myalgias, neck pain and stiffness.  Gastrointestinal: Negative for abdominal pain, bowel incontinence, diarrhea and excessive appetite.  Genitourinary: Negative for decreased libido, genital sores and incomplete emptying.  Neurological: Negative for brief paralysis, focal weakness, headaches and loss of balance.  Psychiatric/Behavioral: Negative for altered mental status, depression and suicidal ideas.  Allergic/Immunologic: Negative for HIV exposure and persistent infections.    EKGs/Labs/Other Studies Reviewed:    The following studies were reviewed today:   EKG: None today  Recent LHC   Dist RCA lesion is 20% stenosed.  Ost Cx to Prox Cx lesion is 20% stenosed.  Prox LAD to Mid LAD lesion is 50% stenosed.  1. Moderate non-obstructive proximal to mid LAD stenosis 2. Mild plaque in the RCA and Circumflex   Recent Labs: 07/30/2019: TSH 3.420 08/20/2019: BUN 19; Creatinine, Ser 1.22;  Hemoglobin 14.0; Platelets 142; Potassium 4.3; Sodium 140  Recent Lipid Panel No results found for: CHOL, TRIG, HDL, CHOLHDL, VLDL, LDLCALC, LDLDIRECT  Physical Exam:    VS:  BP 118/62   Pulse 60   Ht  (1.626 m)   Wt 171 lb 6.4 oz (77.7 kg)   SpO2 97%   BMI 29.42 kg/m     Wt Readings from Last 3 Encounters:  03/22/20 171 lb 6.4 oz (77.7 kg)  11/23/19 172 lb (78 kg)  09/21/19 175 lb (79.4 kg)     GEN: Well nourished, well developed in no acute distress HEENT: Normal NECK: No JVD; No carotid bruits LYMPHATICS: No lymphadenopathy CARDIAC: S1S2 noted,RRR, no murmurs, rubs, gallops RESPIRATORY:  Clear to auscultation without rales, wheezing or rhonchi  ABDOMEN: Soft, non-tender, non-distended, +bowel sounds, no guarding. EXTREMITIES: No edema, No cyanosis, no clubbing MUSCULOSKELETAL:  No deformity  SKIN: Warm and dry NEUROLOGIC:  Alert and oriented x 3, non-focal PSYCHIATRIC:  Normal affect, good insight  ASSESSMENT:    1. Coronary artery disease involving native coronary artery of native heart without angina pectoris   2. Essential hypertension   3. Paroxysmal SVT (supraventricular tachycardia) (HCC)   4. PAC (premature atrial contraction)   5. Obstructive sleep apnea syndrome    PLAN:     1.  We will continue patient on his current aspirin 81 mg daily as well as his Crestor 10 mg daily for his coronary artery disease no anginal symptoms.  He also has had some improvement with his palpitations on the Toprol-XL 12.5 mg daily.  Has had some intermittent outbursts but does not last for long.  I have asked the patient if this happened in the last over 20 minutes to take an additional 12.5 mg of the Toprol-XL.    He will continue with his CPAP.  Hyperlipidemia - continue with current statin medication.    The patient is in agreement with the above plan. The patient left the office in stable condition.  The patient will follow up in 1 year   Medication  Adjustments/Labs and Tests Ordered: Current medicines are reviewed at length with the patient today.  Concerns regarding medicines are outlined above.  No orders of the defined types were placed in this encounter.  No orders of the defined types were placed  in this encounter.   Patient Instructions  Medication Instructions:  Your physician recommends that you continue on your current medications as directed. Please refer to the Current Medication list given to you today.  *If you need a refill on your cardiac medications before your next appointment, please call your pharmacy*   Lab Work: None If you have labs (blood work) drawn today and your tests are completely normal, you will receive your results only by: Marland Kitchen. MyChart Message (if you have MyChart) OR . A paper copy in the mail If you have any lab test that is abnormal or we need to change your treatment, we will call you to review the results.   Testing/Procedures: None   Follow-Up: At [facility], you and your health needs are our priority.  As part of our continuing mission to provide you with exceptional heart care, we have created designated Provider Care Teams.  These Care Teams include your primary Cardiologist (physician) and Advanced Practice Providers (APPs -  Physician Assistants and Nurse Practitioners) who all work together to provide you with the care you need, when you need it.  We recommend signing up for the patient portal called "MyChart".  Sign up information is provided on this After Visit Summary.  MyChart is used to connect with patients for Virtual Visits (Telemedicine).  Patients are able to view lab/test results, encounter notes, upcoming appointments, etc.  Non-urgent messages can be sent to your provider as well.   To learn more about what you can do with MyChart, go to [email or web address].    Your next appointment:   1 year(s)  The format for your next appointment:   In Person  Provider:    Thomasene RippleKardie Marlen Mollica, DO   Other Instructions      Adopting a Healthy Lifestyle.  Know what a healthy weight is for you (roughly BMI <25) and aim to maintain this   Aim for 7+ servings of fruits and vegetables daily   65-80+ fluid ounces of water or unsweet tea for healthy kidneys   Limit to max 1 drink of alcohol per day; avoid smoking/tobacco   Limit animal fats in diet for cholesterol and heart health - choose grass fed whenever available   Avoid highly processed foods, and foods high in saturated/trans fats   Aim for low stress - take time to unwind and care for your mental health   Aim for 150 min of moderate intensity exercise weekly for heart health, and weights twice weekly for bone health   Aim for 7-9 hours of sleep daily   When it comes to diets, agreement about the perfect plan isnt easy to find, even among the experts. Experts at the [employer] developed an idea known as the Healthy Eating Plate. Just imagine a plate divided into logical, healthy portions.   The emphasis is on diet quality:   Load up on vegetables and fruits - one-half of your plate: Aim for color and variety, and remember that potatoes dont count.   Go for whole grains - one-quarter of your plate: Whole wheat, barley, wheat berries, quinoa, oats, brown rice, and foods made with them. If you want pasta, go with whole wheat pasta.   Protein power - one-quarter of your plate: Fish, chicken, beans, and nuts are all healthy, versatile protein sources. Limit red meat.   The diet, however, does go beyond the plate, offering a few other suggestions.   Use healthy plant oils, such as olive, canola, soy,  corn, sunflower and peanut. Check the labels, and avoid partially hydrogenated oil, which have unhealthy trans fats.   If youre thirsty, drink water. Coffee and tea are good in moderation, but skip sugary drinks and limit milk and dairy products to one or two daily servings.   The  type of carbohydrate in the diet is more important than the amount. Some sources of carbohydrates, such as vegetables, fruits, whole grains, and beans-are healthier than others.   Finally, stay active  Signed, Thomasene Ripple, DO  03/22/2020 1:50 PM    [facility]

## 2020-03-23 ENCOUNTER — Encounter: Payer: Self-pay | Admitting: Pulmonary Disease

## 2020-03-23 ENCOUNTER — Ambulatory Visit: Payer: Medicare HMO | Admitting: Pulmonary Disease

## 2020-03-23 VITALS — BP 126/66 | HR 60 | Temp 98.5°F | Ht 64.5 in | Wt 170.2 lb

## 2020-03-23 DIAGNOSIS — G4722 Circadian rhythm sleep disorder, advanced sleep phase type: Secondary | ICD-10-CM

## 2020-03-23 DIAGNOSIS — R0789 Other chest pain: Secondary | ICD-10-CM

## 2020-03-23 DIAGNOSIS — G4733 Obstructive sleep apnea (adult) (pediatric): Secondary | ICD-10-CM

## 2020-03-23 DIAGNOSIS — Z9989 Dependence on other enabling machines and devices: Secondary | ICD-10-CM | POA: Diagnosis not present

## 2020-03-23 NOTE — Progress Notes (Signed)
[place] [facility]  Chief Complaint  Patient presents with  . Follow-up    No complaints currently    Constitutional:  BP 126/66 (BP Location: Left Arm, Cuff Size: Normal)   Pulse 60   Temp 98.5 F (36.9 C) (Temporal)   Ht 5' 4.5" (1.638 m)   Wt 170 lb 3.2 oz (77.2 kg)   SpO2 98% Comment: Room air  BMI 28.76 kg/m   Past Medical History:  Varicose veins, GERD, Neuropathy, HLD, BPH, non obstructive CAD  Past Surgical History:  His  has a past surgical history that includes Appendectomy (5053); Spine surgery; Nasal sinus surgery; Colonoscopy (02/10/2013); and LEFT HEART CATH AND CORONARY ANGIOGRAPHY (N/A, 08/31/2019).  Brief Summary:  [name] [name] is a 80 y.o. male with obstructive sleep apnea.      Subjective:   He got a new CPAP machine.  Working well.  No issues with mask fit.    Has noticed pressure in his left chest intermittently.  Not having bloating or belching.  Sensation doesn't happen consistently, and usually goes away spontaneously.  He is taking a medication for reflux.  He reports having cardiac assessment and chest xray which were unrevealing.  Physical Exam:   Appearance - well kempt   ENMT - no sinus tenderness, no oral exudate, no LAN, Mallampati 3 airway, no stridor  Respiratory - equal breath sounds bilaterally, no wheezing or rales  CV - s1s2 regular rate and rhythm, no murmurs  Ext - no clubbing, no edema  Skin - no rashes  Psych - normal mood and affect    Sleep Tests:   PSG 04/21/03 >> AHI 54  ONO with CPAP 10/09/19 >> test time 7 hrs 48 min.  Baseline SpO2 95%.  Low SpO2 80%.  Spent 3 min 8 sec with SpO2 < 88%.  CPAP 02/22/20 to 03/22/20 >> used on 30 of 30 nights with average 7 hrs 22 min.  Average AHI 6.7 with CPAP 15 cm H2O  Cardiac Tests:   Echo 08/18/19 >> EF 60 to 65%, grade 1 DD  Social History:  He  reports that he quit smoking about 56 years ago. His smoking use included cigarettes. He  started smoking about 68 years ago. He has a 12.00 pack-year smoking history. He has never used smokeless tobacco. He reports that he does not drink alcohol and does not use drugs.  Family History:  His family history includes Allergies in his brother, father, mother, and sister; Asthma in his brother; Cancer in his brother and sister; Heart disease in his brother, mother, and sister.     Assessment/Plan:   Obstructive sleep apnea - he is compliant with CPAP and reports benefit from therapy - uses [employer] for his DME - continue CPAP 15 cm H2O  Advanced sleep phase. - he was not able to tolerate phase shifting techniques - current sleep pattern is 10 pm to 4 am  Chest pressure. - don't think this is from aerophagia related to CPAP use - could be related to being on relatively higher CPAP settings, but these are same settings he has been on for a while - he feels his symptoms are mild at present - advised that if his symptoms progress, then we could try lowering his CPAP from 15 to 13 cm H2O   Time Spent Involved in Patient Care on Day of Examination:  24 minutes  Follow up:  Patient Instructions  Follow up in 1 year  Medication List:   Allergies as of 03/23/2020      Reactions   Penicillins Other (See Comments)   Does not work for pt (ineffective)      Medication List       Accurate as of March 23, 2020 10:45 AM. If you have any questions, ask your nurse or doctor.        aspirin EC 81 MG tablet Take 1 tablet (81 mg total) by mouth daily. Swallow whole.   B-12 IJ Inject into the muscle.   cholecalciferol 25 MCG (1000 UNIT) tablet Commonly known as: VITAMIN D Take 1,000 Units by mouth daily.   fluticasone 50 MCG/ACT nasal spray Commonly known as: FLONASE Place 1-2 sprays into both nostrils daily as needed for allergies.   KONSYL FIBER PO Take 1 Dose by mouth daily. 1 teaspoon   metoprolol succinate 25 MG 24 hr tablet Commonly known as: TOPROL-XL Take  1/2 (one-half) tablet by mouth once daily   naproxen sodium 220 MG tablet Commonly known as: ALEVE Take 220 mg by mouth 2 (two) times daily as needed (back pain.).   nitroGLYCERIN 0.4 MG SL tablet Commonly known as: NITROSTAT Place 1 tablet (0.4 mg total) under the tongue every 5 (five) minutes as needed.   pantoprazole 40 MG tablet Commonly known as: PROTONIX Take 1 tablet (40 mg total) by mouth daily.   rosuvastatin 10 MG tablet Commonly known as: CRESTOR Take 1 tablet (10 mg total) by mouth daily.   tamsulosin 0.4 MG Caps capsule Commonly known as: FLOMAX Take 0.4 mg by mouth at bedtime.       Signature:  Coralyn Helling, MD [facility] Pager - [phone] 03/23/2020, 10:45 AM

## 2020-03-23 NOTE — Patient Instructions (Signed)
Follow up in 1 year.

## 2020-03-28 DIAGNOSIS — N1831 Chronic kidney disease, stage 3a: Secondary | ICD-10-CM | POA: Diagnosis not present

## 2020-03-28 DIAGNOSIS — E538 Deficiency of other specified B group vitamins: Secondary | ICD-10-CM | POA: Diagnosis not present

## 2020-03-28 DIAGNOSIS — E785 Hyperlipidemia, unspecified: Secondary | ICD-10-CM | POA: Diagnosis not present

## 2020-03-28 DIAGNOSIS — G4733 Obstructive sleep apnea (adult) (pediatric): Secondary | ICD-10-CM | POA: Diagnosis not present

## 2020-04-26 DIAGNOSIS — G4733 Obstructive sleep apnea (adult) (pediatric): Secondary | ICD-10-CM | POA: Diagnosis not present

## 2020-05-27 DIAGNOSIS — G4733 Obstructive sleep apnea (adult) (pediatric): Secondary | ICD-10-CM | POA: Diagnosis not present

## 2020-05-31 DIAGNOSIS — H8110 Benign paroxysmal vertigo, unspecified ear: Secondary | ICD-10-CM | POA: Diagnosis not present

## 2020-05-31 DIAGNOSIS — R42 Dizziness and giddiness: Secondary | ICD-10-CM | POA: Diagnosis not present

## 2020-05-31 DIAGNOSIS — R110 Nausea: Secondary | ICD-10-CM | POA: Diagnosis not present

## 2020-05-31 DIAGNOSIS — J3489 Other specified disorders of nose and nasal sinuses: Secondary | ICD-10-CM | POA: Diagnosis not present

## 2020-05-31 DIAGNOSIS — R1110 Vomiting, unspecified: Secondary | ICD-10-CM | POA: Diagnosis not present

## 2020-05-31 DIAGNOSIS — I6529 Occlusion and stenosis of unspecified carotid artery: Secondary | ICD-10-CM | POA: Diagnosis not present

## 2020-06-26 DIAGNOSIS — G4733 Obstructive sleep apnea (adult) (pediatric): Secondary | ICD-10-CM | POA: Diagnosis not present

## 2020-07-18 ENCOUNTER — Telehealth: Payer: Self-pay

## 2020-07-18 MED ORDER — ROSUVASTATIN CALCIUM 10 MG PO TABS: 10.0000 mg | ORAL_TABLET | Freq: Every day | ORAL | 5 refills | Status: DC

## 2020-07-18 NOTE — Telephone Encounter (Signed)
Refill sent to pharmacy.   

## 2020-07-27 DIAGNOSIS — G4733 Obstructive sleep apnea (adult) (pediatric): Secondary | ICD-10-CM | POA: Diagnosis not present

## 2020-08-26 DIAGNOSIS — G4733 Obstructive sleep apnea (adult) (pediatric): Secondary | ICD-10-CM | POA: Diagnosis not present

## 2020-09-05 ENCOUNTER — Other Ambulatory Visit: Payer: Self-pay | Admitting: Cardiology

## 2020-09-26 DIAGNOSIS — G4733 Obstructive sleep apnea (adult) (pediatric): Secondary | ICD-10-CM | POA: Diagnosis not present

## 2020-11-10 DIAGNOSIS — Z23 Encounter for immunization: Secondary | ICD-10-CM | POA: Diagnosis not present

## 2020-12-26 DIAGNOSIS — R739 Hyperglycemia, unspecified: Secondary | ICD-10-CM | POA: Diagnosis not present

## 2020-12-26 DIAGNOSIS — E538 Deficiency of other specified B group vitamins: Secondary | ICD-10-CM | POA: Diagnosis not present

## 2020-12-26 DIAGNOSIS — E785 Hyperlipidemia, unspecified: Secondary | ICD-10-CM | POA: Diagnosis not present

## 2021-01-09 DIAGNOSIS — R972 Elevated prostate specific antigen [PSA]: Secondary | ICD-10-CM | POA: Diagnosis not present

## 2021-01-09 DIAGNOSIS — N403 Nodular prostate with lower urinary tract symptoms: Secondary | ICD-10-CM | POA: Diagnosis not present

## 2021-01-09 DIAGNOSIS — N401 Enlarged prostate with lower urinary tract symptoms: Secondary | ICD-10-CM | POA: Diagnosis not present

## 2021-01-09 DIAGNOSIS — Z8042 Family history of malignant neoplasm of prostate: Secondary | ICD-10-CM | POA: Diagnosis not present

## 2021-01-09 DIAGNOSIS — N138 Other obstructive and reflux uropathy: Secondary | ICD-10-CM | POA: Diagnosis not present

## 2021-01-11 DIAGNOSIS — N1831 Chronic kidney disease, stage 3a: Secondary | ICD-10-CM | POA: Diagnosis not present

## 2021-01-11 DIAGNOSIS — G720 Drug-induced myopathy: Secondary | ICD-10-CM | POA: Diagnosis not present

## 2021-01-11 DIAGNOSIS — Z0000 Encounter for general adult medical examination without abnormal findings: Secondary | ICD-10-CM | POA: Diagnosis not present

## 2021-01-11 DIAGNOSIS — E538 Deficiency of other specified B group vitamins: Secondary | ICD-10-CM | POA: Diagnosis not present

## 2021-01-11 DIAGNOSIS — J410 Simple chronic bronchitis: Secondary | ICD-10-CM | POA: Diagnosis not present

## 2021-01-11 DIAGNOSIS — J301 Allergic rhinitis due to pollen: Secondary | ICD-10-CM | POA: Diagnosis not present

## 2021-01-11 DIAGNOSIS — D510 Vitamin B12 deficiency anemia due to intrinsic factor deficiency: Secondary | ICD-10-CM | POA: Diagnosis not present

## 2021-01-11 DIAGNOSIS — K2960 Other gastritis without bleeding: Secondary | ICD-10-CM | POA: Diagnosis not present

## 2021-01-11 DIAGNOSIS — I25119 Atherosclerotic heart disease of native coronary artery with unspecified angina pectoris: Secondary | ICD-10-CM | POA: Diagnosis not present

## 2021-01-11 DIAGNOSIS — E785 Hyperlipidemia, unspecified: Secondary | ICD-10-CM | POA: Diagnosis not present

## 2021-04-04 ENCOUNTER — Encounter: Payer: Self-pay | Admitting: Pulmonary Disease

## 2021-04-04 ENCOUNTER — Ambulatory Visit: Payer: Medicare HMO | Admitting: Pulmonary Disease

## 2021-04-04 ENCOUNTER — Other Ambulatory Visit: Payer: Self-pay

## 2021-04-04 ENCOUNTER — Ambulatory Visit (INDEPENDENT_AMBULATORY_CARE_PROVIDER_SITE_OTHER): Payer: Medicare HMO

## 2021-04-04 VITALS — BP 130/78 | HR 58 | Temp 97.9°F | Ht 64.0 in | Wt 175.2 lb

## 2021-04-04 DIAGNOSIS — R0609 Other forms of dyspnea: Secondary | ICD-10-CM

## 2021-04-04 DIAGNOSIS — Z8616 Personal history of COVID-19: Secondary | ICD-10-CM

## 2021-04-04 DIAGNOSIS — R0600 Dyspnea, unspecified: Secondary | ICD-10-CM | POA: Diagnosis not present

## 2021-04-04 DIAGNOSIS — G4733 Obstructive sleep apnea (adult) (pediatric): Secondary | ICD-10-CM | POA: Diagnosis not present

## 2021-04-04 LAB — CBC WITH DIFFERENTIAL/PLATELET
Basophils Absolute: 0 10*3/uL (ref 0.0–0.1)
Basophils Relative: 0.8 % (ref 0.0–3.0)
Eosinophils Absolute: 0.3 10*3/uL (ref 0.0–0.7)
Eosinophils Relative: 5.9 % — ABNORMAL HIGH (ref 0.0–5.0)
HCT: 40.3 % (ref 39.0–52.0)
Hemoglobin: 13.8 g/dL (ref 13.0–17.0)
Lymphocytes Relative: 31.6 % (ref 12.0–46.0)
Lymphs Abs: 1.6 10*3/uL (ref 0.7–4.0)
MCHC: 34.3 g/dL (ref 30.0–36.0)
MCV: 90.3 fl (ref 78.0–100.0)
Monocytes Absolute: 0.4 10*3/uL (ref 0.1–1.0)
Monocytes Relative: 8.9 % (ref 3.0–12.0)
Neutro Abs: 2.7 10*3/uL (ref 1.4–7.7)
Neutrophils Relative %: 52.8 % (ref 43.0–77.0)
Platelets: 124 10*3/uL — ABNORMAL LOW (ref 150.0–400.0)
RBC: 4.47 Mil/uL (ref 4.22–5.81)
RDW: 14.3 % (ref 11.5–15.5)
WBC: 5 10*3/uL (ref 4.0–10.5)

## 2021-04-04 LAB — COMPREHENSIVE METABOLIC PANEL
ALT: 22 U/L (ref 0–53)
AST: 21 U/L (ref 0–37)
Albumin: 4.4 g/dL (ref 3.5–5.2)
Alkaline Phosphatase: 74 U/L (ref 39–117)
BUN: 18 mg/dL (ref 6–23)
CO2: 29 mEq/L (ref 19–32)
Calcium: 9.5 mg/dL (ref 8.4–10.5)
Chloride: 107 mEq/L (ref 96–112)
Creatinine, Ser: 1.15 mg/dL (ref 0.40–1.50)
GFR: 59.11 mL/min — ABNORMAL LOW (ref >60.00)
Glucose, Bld: 104 mg/dL — ABNORMAL HIGH (ref 70–99)
Potassium: 4.5 mEq/L (ref 3.5–5.1)
Sodium: 140 mEq/L (ref 135–145)
Total Bilirubin: 0.5 mg/dL (ref 0.2–1.2)
Total Protein: 6.9 g/dL (ref 6.0–8.3)

## 2021-04-04 MED ORDER — ALBUTEROL SULFATE HFA 108 (90 BASE) MCG/ACT IN AERS: 2.0000 | INHALATION_SPRAY | Freq: Four times a day (QID) | RESPIRATORY_TRACT | 5 refills | Status: AC | PRN

## 2021-04-04 NOTE — Progress Notes (Signed)
? ?[place] [facility] ? ?Chief Complaint  ?Patient presents with  ? Follow-up  ?  Follow up. Patient says after he had covid he can't get his breathing back normal.   ? ? ?Constitutional:  ?BP 130/78 (BP Location: Left Arm, Patient Position: Sitting, Cuff Size: Normal)   Pulse (!) 58   Temp 97.9 ?F (36.6 ?C) (Oral)   Ht 5\' 4"  (1.626 m)   Wt 175 lb 3.2 oz (79.5 kg)   SpO2 100%   BMI 30.07 kg/m?  ? ?Past Medical History:  ?Varicose veins, GERD, Neuropathy, HLD, BPH, non obstructive CAD ? ?Past Surgical History:  ?His  has a past surgical history that includes Appendectomy [id number]); Spine surgery; Nasal sinus surgery; Colonoscopy (02/10/2013); and LEFT HEART CATH AND CORONARY ANGIOGRAPHY (N/A, 08/31/2019). ? ?Brief Summary:  ?[name] [name] is a 81 y.o. male with obstructive sleep apnea. ?  ? ? ? ?Subjective:  ? ?He had COVID after Thanksgiving.  He did a home test.  Didn't seek medical attention at that time.  Since then he has brain fog.  He feels fatigued all the time.  He gets winded with walking and feels tight in his chest.  He has a dry cough and gets runny nose.  He is not having GI symptoms, fever, hemoptysis, joint swelling, skin rash, or leg swelling.  He is sleeping more at night and during the day.  He uses CPAP nightly.  No issues with mask fit. ? ?Physical Exam:  ? ?Appearance - well kempt  ? ?ENMT - no sinus tenderness, no oral exudate, no LAN, Mallampati 3 airway, no stridor ? ?Respiratory - equal breath sounds bilaterally, no wheezing or rales ? ?CV - s1s2 regular rate and rhythm, no murmurs ? ?Ext - no clubbing, no edema ? ?Skin - no rashes ? ?Psych - normal mood and affect ? ? ?  ?Sleep Tests:  ?PSG 04/21/03 >> AHI 54 ?ONO with CPAP 10/09/19 >> test time 7 hrs 48 min.  Baseline SpO2 95%.  Low SpO2 80%.  Spent 3 min 8 sec with SpO2 < 88%. ?CPAP 03/04/21 to 04/02/21 >> used on 30 of 30 nights with average 7 hrs 31 min.  Average AHI 14.7 with CPAP 15 cm H2O ? ?Cardiac  Tests:  ?Echo 08/18/19 >> EF 60 to 65%, grade 1 DD ? ?Social History:  ?He  reports that he quit smoking about 57 years ago. His smoking use included cigarettes. He started smoking about 69 years ago. He has a 12.00 pack-year smoking history. He has never used smokeless tobacco. He reports that he does not drink alcohol and does not use drugs. ? ?Family History:  ?His family history includes Allergies in his brother, father, mother, and sister; Asthma in his brother; Cancer in his brother and sister; Heart disease in his brother, mother, and sister. ?  ? ? ?Assessment/Plan:  ? ?Dyspnea on exertion after COVID 19 infection in November 2022 with history of childhood asthma, and prior history of smoking. ?- will arrange for CBC with Diff, CMET, IgE, D dimer, Chest xray, PFT, and FeNO ?- will have him try albuterol prn ?- he has cardiology assessment in couple of weeks already scheduled ?- could have long COVID ? ?Obstructive sleep apnea ?- he is compliant with CPAP and reports benefit from therapy ?- uses [employer] for his DME ?- will have his CPAP increased to 16 cm H2O ?  ? ?Time Spent Involved in Patient Care on Day of  Examination:  ?45 minutes ? ?Follow up:  ? ?Patient Instructions  ?Chest xray and lab tests today. ? ?Will schedule pulmonary function test and FeNO breathing test. ? ?Albuterol two puffs every 6 hours as needed for cough, wheeze, chest congestion, or chest tightness.  You can use this also bring to exercising. ? ?Will have your CPAP setting increased to 16 cm water pressure. ? ?Follow up in 6 weeks with Dr. Halford Chessman or Nurse Practitioner. ? ?Medication List:  ? ?Allergies as of 04/04/2021   ? ?   Reactions  ? Penicillins Other (See Comments)  ? Does not work for pt (ineffective)  ? ?  ? ?  ?Medication List  ?  ? ?  ? Accurate as of April 04, 2021 10:32 AM. If you have any questions, ask your nurse or doctor.  ?  ?  ? ?  ? ?albuterol 108 (90 Base) MCG/ACT inhaler ?Commonly known as: Ventolin HFA ?Inhale 2 puffs  into the lungs every 6 (six) hours as needed for wheezing or shortness of breath. ?Started by: [name], MD ?  ?aspirin EC 81 MG tablet ?Take 1 tablet (81 mg total) by mouth daily. Swallow whole. ?  ?B-12 IJ ?Inject into the muscle. ?  ?cholecalciferol 25 MCG (1000 UNIT) tablet ?Commonly known as: VITAMIN D ?Take 1,000 Units by mouth daily. ?  ?fluticasone 50 MCG/ACT nasal spray ?Commonly known as: FLONASE ?Place 1-2 sprays into both nostrils daily as needed for allergies. ?  ?KONSYL FIBER PO ?Take 1 Dose by mouth daily. 1 teaspoon ?  ?metoprolol succinate 25 MG 24 hr tablet ?Commonly known as: TOPROL-XL ?Take 1/2 (one-half) tablet by mouth once daily ?  ?naproxen sodium 220 MG tablet ?Commonly known as: ALEVE ?Take 220 mg by mouth 2 (two) times daily as needed (back pain.). ?  ?nitroGLYCERIN 0.4 MG SL tablet ?Commonly known as: NITROSTAT ?Place 1 tablet (0.4 mg total) under the tongue every 5 (five) minutes as needed. ?  ?pantoprazole 40 MG tablet ?Commonly known as: PROTONIX ?Take 1 tablet (40 mg total) by mouth daily. ?  ?rosuvastatin 10 MG tablet ?Commonly known as: CRESTOR ?Take 1 tablet (10 mg total) by mouth daily. ?  ?tamsulosin 0.4 MG Caps capsule ?Commonly known as: FLOMAX ?Take 0.4 mg by mouth at bedtime. ?  ? ?  ? ? ?Signature:  ?[name], MD ?[place] ?Pager - [phone] ?04/04/2021, 10:32 AM ?  ? ? ? ? ? ? ? ? ?

## 2021-04-04 NOTE — Patient Instructions (Signed)
Chest xray and lab tests today. ? ?Will schedule pulmonary function test and FeNO breathing test. ? ?Albuterol two puffs every 6 hours as needed for cough, wheeze, chest congestion, or chest tightness.  You can use this also bring to exercising. ? ?Will have your CPAP setting increased to 16 cm water pressure. ? ?Follow up in 6 weeks with Dr. Craige Cotta or Nurse Practitioner. ?

## 2021-04-05 ENCOUNTER — Telehealth: Payer: Self-pay | Admitting: Pulmonary Disease

## 2021-04-05 DIAGNOSIS — R7989 Other specified abnormal findings of blood chemistry: Secondary | ICD-10-CM

## 2021-04-05 DIAGNOSIS — R071 Chest pain on breathing: Secondary | ICD-10-CM

## 2021-04-05 LAB — IGE: IgE (Immunoglobulin E), Serum: 17 kU/L (ref <=114)

## 2021-04-05 LAB — D-DIMER, QUANTITATIVE: D-Dimer, Quant: 1.51 mcg/mL FEU — ABNORMAL HIGH (ref <0.50)

## 2021-04-05 NOTE — Telephone Encounter (Signed)
DG Chest 2 View ? ?Result Date: 04/04/2021 ?CLINICAL DATA:  Dyspnea on exertion.  COVID history. EXAM: CHEST - 2 VIEW COMPARISON:  May 31, 2020 FINDINGS: The heart size and mediastinal contours are within normal limits. Both lungs are clear. The visualized skeletal structures are unremarkable. IMPRESSION: No active cardiopulmonary disease. Electronically Signed   By: Gerome Sam III M.D.   On: 04/04/2021 19:38   ? ?CBC ?   ?Component Value Date/Time  ? WBC 5.0 04/04/2021 1045  ? RBC 4.47 04/04/2021 1045  ? HGB 13.8 04/04/2021 1045  ? HGB 14.0 08/20/2019 1242  ? HCT 40.3 04/04/2021 1045  ? HCT 41.9 08/20/2019 1242  ? PLT 124.0 (L) 04/04/2021 1045  ? PLT 142 (L) 08/20/2019 1242  ? MCV 90.3 04/04/2021 1045  ? MCV 90 08/20/2019 1242  ? MCH 30.1 08/20/2019 1242  ? MCH 30.3 10/04/2010 1047  ? MCHC 34.3 04/04/2021 1045  ? RDW 14.3 04/04/2021 1045  ? RDW 13.9 08/20/2019 1242  ? LYMPHSABS 1.6 04/04/2021 1045  ? LYMPHSABS 1.6 08/20/2019 1242  ? MONOABS 0.4 04/04/2021 1045  ? EOSABS 0.3 04/04/2021 1045  ? EOSABS 0.2 08/20/2019 1242  ? BASOSABS 0.0 04/04/2021 1045  ? BASOSABS 0.1 08/20/2019 1242  ? ? ?CMP Latest Ref Rng & Units 04/04/2021 08/20/2019 10/04/2010  ?Glucose 70 - 99 mg/dL 989(Q) 88 119(E)  ?BUN 6 - 23 mg/dL 18 19 16   ?Creatinine 0.40 - 1.50 mg/dL 1.74 0.81  ?Sodium 135 - 145 mEq/L 140 140 144  ?Potassium 3.5 - 5.1 mEq/L 4.5 4.3 3.9  ?Chloride 96 - 112 mEq/L 107 108(H) 108  ?CO2 19 - 32 mEq/L 29 21 29   ?Calcium 8.4 - 10.5 mg/dL 9.5 9.3 9.8  ?Total Protein 6.0 - 8.3 g/dL 6.9 - -  ?Total Bilirubin 0.2 - 1.2 mg/dL 0.5 - -  ?Alkaline Phos 39 - 117 U/L 74 - -  ?AST 0 - 37 U/L 21 - -  ?ALT 0 - 53 U/L 22 - -  ? ? ?Lab Results  ?Component Value Date  ? DDIMER 1.51 (H) 04/04/2021  ? ? ?Results d/w pt.  He has elevated d dimer.  Will arrange for V/Q scan to assess for chronic PE.  He also reports intermittent calf pains b/l - depending on results of V/Q scan, he might also need to get doppler of legs done. ?

## 2021-04-06 ENCOUNTER — Other Ambulatory Visit: Payer: Self-pay

## 2021-04-06 DIAGNOSIS — R071 Chest pain on breathing: Secondary | ICD-10-CM

## 2021-04-06 NOTE — Addendum Note (Signed)
Addended by: Carleene Mains D on: 04/06/2021 03:37 PM ? ? Modules accepted: Orders ? ?

## 2021-04-06 NOTE — Progress Notes (Signed)
Cxr order placed for vq scan  ?

## 2021-04-06 NOTE — Telephone Encounter (Signed)
Spoke to [facility] and order needs to be changed to NM Pulm Vent order instead of perf and vent. Order changed. CXR also needs to be performed before pt can get scan but cxr was done on 04/04/21. Marchelle Folks states this should be okay.  ? ?Will route to Dr. Craige Cotta so he is aware of order change.  ?

## 2021-04-06 NOTE — Addendum Note (Signed)
Addended by: Carleene Mains D on: 04/06/2021 02:21 PM ? ? Modules accepted: Orders ? ?

## 2021-04-14 ENCOUNTER — Ambulatory Visit (HOSPITAL_COMMUNITY)
Admission: RE | Admit: 2021-04-14 | Discharge: 2021-04-14 | Disposition: A | Discharge status: Home / Self Care | Payer: Medicare HMO | Source: Ambulatory Visit | Attending: Pulmonary Disease | Admitting: Pulmonary Disease

## 2021-04-14 ENCOUNTER — Other Ambulatory Visit: Payer: Self-pay

## 2021-04-14 DIAGNOSIS — Z0389 Encounter for observation for other suspected diseases and conditions ruled out: Secondary | ICD-10-CM | POA: Diagnosis not present

## 2021-04-14 DIAGNOSIS — R071 Chest pain on breathing: Secondary | ICD-10-CM | POA: Insufficient documentation

## 2021-04-14 DIAGNOSIS — J439 Emphysema, unspecified: Secondary | ICD-10-CM | POA: Diagnosis not present

## 2021-04-14 MED ORDER — TECHNETIUM TO 99M ALBUMIN AGGREGATED
4.2000 | Freq: Once | INTRAVENOUS | Status: AC | PRN
  Administered 2021-04-14: 4.2 via INTRAVENOUS

## 2021-04-17 NOTE — Progress Notes (Signed)
?Cardiology Office Note:   ? ?Date:  04/18/2021  ? ?ID:  [name] [name], DOB [birth year], MRN [id number] ? ?PCP:  Street, Sharon Mt, MD  ?Cardiologist:  Shirlee More, MD   ? ?Referring MD: Street, Sharon Mt, *  ? ? ?ASSESSMENT:   ? ?1. SOB (shortness of breath) on exertion   ?2. Mild CAD   ?3. Essential hypertension   ?4. Mixed hyperlipidemia   ?5. Paroxysmal SVT (supraventricular tachycardia) (HCC)   ? ?PLAN:   ? ?In order of problems listed above: ? ?His clinical problem is shortness of breath increasing since COVID-19 infection differential diagnosis pulmonary COVID related injury versus cardiac.  I will go ahead and order a high-resolution CT schedule for PFTs I will try to move this forward he will continue his bronchodilator which has been of no benefit.  For heart failure evaluation proBNP level echocardiogram and if the studies are all unrevealing with his exertional chest pain we will go ahead and order a cardiac CTA. ?Stable CAD at this time continue his current medical treatment including beta-blocker statin ?Lipids are at target ?He has some mild symptoms of palpitation not severe or sustained and unrelated to his shortness of breath and do not think he needs an event monitor at this time ? ? ? ? ?Next appointment: 4 weeks ? ? ?Medication Adjustments/Labs and Tests Ordered: ?Current medicines are reviewed at length with the patient today.  Concerns regarding medicines are outlined above.  ?No orders of the defined types were placed in this encounter. ? ?No orders of the defined types were placed in this encounter. ? ? ?Chief Complaint  ?Patient presents with  ? Follow-up  ? Coronary Artery Disease  ? ? ?History of Present Illness:   ? ?[name] [name] is a 81 y.o. male with a hx of mild coronary artery disease cath 08/31/2019 with  50% mid LAD stenosis, COPD, BPH, hyperlipidemia, symptomatic PACs and sleep apnea on CPAP  last seen 03/22/2020. ? ?Compliance with diet, lifestyle and medications:  Yes ? ?This began as a CAD follow-up visit. ?His clinical problem is increasing shortness of breath since COVID-19 infection the end of November outpatient with no medical therapy he self diagnosed with a over-the-counter testt. ?Since that time he has not done well he finds himself mildly short of breath at rest no orthopnea but he does have edema uses CPAP at nighttime still lacks a mile every day but really struggles and at times has chest tightness.  Walks through the symptoms.  He is concerned that he has not improved. ?Seen by pulmonary elevated D-dimer ventilation/perfusion lung scan low risk and chest x-ray unremarkable.  He has no history of heart failure or valvular heart disease.Marland Kitchen ?He has no history of heart failure or valvular heart disease: ?Past Medical History:  ?Diagnosis Date  ? Acute prostatitis 12/19/2015  ? Acute respiratory infection   ? Allergic rhinitis, seasonal   ? Apnea, sleep 09/18/2007  ? NPSG 2005:  AHI 54/hr New machine 2014 Auto 2014:  Optimal pressure 20cm, but still with some leak and breakthru events.  Download 2015:  Pressure 18, no significant leaks, AHI 12/hr.   ? 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 Benign prostatic hyperplasia with lower urinary tract symptoms 12/19/2015  ? Breath shortness 07/29/2019  ? Central hearing loss   ? Chest pain   ? Chronic infection of sinus 07/29/2019  ? Chronic kidney disease, stage 3 (HCC)   ? Chronic obstructive pulmonary disease (COPD) ([place])   ?  Chronic prostatitis   ? Chronic sinusitis   ? 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 DOE (dyspnea on exertion) 07/22/2012  ? Dyslipidemia   ? Dysuria   ? Elevated PSA   ? Elevated PSA, less than 10 ng/ml   ? Encounter for screening for cardiovascular disorders 07/29/2019  ? Enlarged prostate   ? Esophageal reflux   ? Esophagitis, reflux 07/29/2019  ? Family history of prostate cancer 10/22/2018  ? Flu vaccine need 07/29/2019  ? GERD (gastroesophageal reflux disease)   ?  Hemorrhoids   ? Herniated nucleus pulposus, L3-4 left   ? HLD (hyperlipidemia) 07/29/2019  ? Hyperlipidemia   ? Hypertrophy of nasal turbinates 07/29/2019  ? Hypochromic microcytic anemia   ? Internal and external prolapsed hemorrhoids   ? LBP (low back pain) 07/29/2019  ? 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 Lower back pain   ? Lumbar neuritis   ? Nasal turbinate hypertrophy   ? Obstructive sleep apnea 09/18/2007  ? NPSG 2005:  AHI 54/hr New machine 2014 Auto 2014:  Optimal pressure 20cm, but still with some leak and breakthru events.  Download 2015:  Pressure 18, no significant leaks, AHI 12/hr.    ? OSA (obstructive sleep apnea)   ? severe  ? Other long term (current) drug therapy 07/29/2019  ? Overweight   ? Pain of left hand 09/09/2017  ? 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 Peripheral neuropathy   ? Radiculopathy of lumbar region 07/29/2019  ? Reflux esophagitis   ? Seasonal allergic rhinitis   ? Shortness of breath   ? Sleep apnea   ? 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 ? ?Past Surgical History:  ?Procedure Laterality Date  ? APPENDECTOMY  1957  ? COLONOSCOPY  02/10/2013  ? Tubular Adenoma  ? LEFT HEART CATH AND CORONARY ANGIOGRAPHY N/A 08/31/2019  ? Procedure: LEFT HEART CATH AND CORONARY ANGIOGRAPHY;  Surgeon: Burnell Blanks, MD;  Location: [place] [facility];  Service: Cardiovascular;  Laterality: N/A;  ? NASAL SINUS SURGERY    ? SPINE SURGERY    ? ? ?Current Medications: ?Current Meds  ?Medication Sig  ? acetaminophen (TYLENOL) 500 MG tablet Take 500 mg by mouth at bedtime.  ? albuterol (VENTOLIN HFA) 108 (90 Base) MCG/ACT inhaler Inhale 2 puffs into the lungs every 6 (six) hours as needed for wheezing or shortness of breath.  ? aspirin EC 81 MG tablet Take 1 tablet (81 mg total) by mouth daily. Swallow whole.  ? Calcium Polycarbophil (KONSYL FIBER PO) Take 1 Dose by mouth daily. 1 teaspoon  ? cholecalciferol (VITAMIN D) 25 MCG (1000 UNIT) tablet Take 1,000 Units by mouth daily.  ?  Cyanocobalamin (B-12 IJ) Inject into the muscle.  ? fluticasone (FLONASE) 50 MCG/ACT nasal spray Place 1-2 sprays into both nostrils daily as needed for allergies.   ? metoprolol succinate (TOPROL-XL) 25 MG 24 hr tablet Take 1/2 (one-half) tablet by mouth once daily  ? naproxen sodium (ALEVE) 220 MG tablet Take 220 mg by mouth 2 (two) times daily as needed (back pain.).   ? nitroGLYCERIN (NITROSTAT) 0.4 MG SL tablet Place 1 tablet (0.4 mg total) under the tongue every 5 (five) minutes as needed.  ? pantoprazole (PROTONIX) 40 MG tablet Take 1 tablet (40 mg total) by mouth daily.  ? rosuvastatin (CRESTOR) 10 MG tablet Take 1 tablet (10 mg total) by mouth daily.  ? tamsulosin (FLOMAX) 0.4 MG CAPS capsule Take 0.4 mg  by mouth at bedtime.   ?  ? ?Allergies:   Penicillins  ? ?Social History  ? ?Socioeconomic History  ? Marital status: Married  ?  Spouse name: Not on file  ? Number of children: Not on file  ? Years of education: Not on file  ? Highest education level: Not on file  ?Occupational History  ? Occupation: retired  ?Tobacco Use  ? Smoking status: Former  ?  Packs/day: 1.00  ?  Years: 12.00  ?  Pack years: 12.00  ?  Types: Cigarettes  ?  Start date: 74  ?  Quit date: 01/30/1964  ?  Years since quitting: 57.2  ? Smokeless tobacco: Never  ?Substance and Sexual Activity  ? Alcohol use: No  ? Drug use: No  ? Sexual activity: Not on file  ?Other Topics Concern  ? Not on file  ?Social History Narrative  ? Not on file  ? ?Social Determinants of Health  ? ?Financial Resource Strain: Not on file  ?Food Insecurity: Not on file  ?Transportation Needs: Not on file  ?Physical Activity: Not on file  ?Stress: Not on file  ?Social Connections: Not on file  ?  ? ?Family History: ?The patient's family history includes Allergies in his brother, father, mother, and sister; Asthma in his brother; Cancer in his brother and sister; Heart disease in his brother, mother, and sister. ?ROS:   ?Please see the history of present illness.     ?All other systems reviewed and are negative. ? ?EKGs/Labs/Other Studies Reviewed:   ? ?The following studies were reviewed today: ? ?EKG:  EKG ordered today and personally reviewed.  The ekg ordered today demons

## 2021-04-18 ENCOUNTER — Ambulatory Visit: Payer: Medicare HMO | Admitting: Cardiology

## 2021-04-18 ENCOUNTER — Other Ambulatory Visit: Payer: Self-pay

## 2021-04-18 ENCOUNTER — Encounter: Payer: Self-pay | Admitting: Cardiology

## 2021-04-18 VITALS — BP 122/72 | HR 60 | Ht 64.0 in | Wt 178.0 lb

## 2021-04-18 DIAGNOSIS — I10 Essential (primary) hypertension: Secondary | ICD-10-CM

## 2021-04-18 DIAGNOSIS — I471 Supraventricular tachycardia: Secondary | ICD-10-CM | POA: Diagnosis not present

## 2021-04-18 DIAGNOSIS — R0602 Shortness of breath: Secondary | ICD-10-CM | POA: Diagnosis not present

## 2021-04-18 DIAGNOSIS — E782 Mixed hyperlipidemia: Secondary | ICD-10-CM | POA: Diagnosis not present

## 2021-04-18 DIAGNOSIS — I2510 Atherosclerotic heart disease of native coronary artery without angina pectoris: Secondary | ICD-10-CM

## 2021-04-18 NOTE — Patient Instructions (Signed)
Medication Instructions:  ?Your physician recommends that you continue on your current medications as directed. Please refer to the Current Medication list given to you today.  ?*If you need a refill on your cardiac medications before your next appointment, please call your pharmacy* ? ? ?Lab Work: ?Your physician recommends that you have a Pro BNP today.  ? ?If you have labs (blood work) drawn today and your tests are completely normal, you will receive your results only by: ?MyChart Message (if you have MyChart) OR ?A paper copy in the mail ?If you have any lab test that is abnormal or we need to change your treatment, we will call you to review the results. ? ? ?Testing/Procedures: ?2D Echocardiogram ?Your physician has requested that you have an echocardiogram. Echocardiography is a painless test that uses sound waves to create images of your heart. It provides your doctor with information about the size and shape of your heart and how well your heart?s chambers and valves are working. This procedure takes approximately one hour. There are no restrictions for this procedure.  ? ?CT Chest ?Non-Cardiac CT scanning, (CAT scanning), is a noninvasive, special x-ray that produces cross-sectional images of the body using x-rays and a computer. CT scans help physicians diagnose and treat medical conditions. For some CT exams, a contrast material is used to enhance visibility in the area of the body being studied. CT scans provide greater clarity and reveal more details than regular x-ray exams.  ? ? ?Follow-Up: ?At [facility], you and your health needs are our priority.  As part of our continuing mission to provide you with exceptional heart care, we have created designated Provider Care Teams.  These Care Teams include your primary Cardiologist (physician) and Advanced Practice Providers (APPs -  Physician Assistants and Nurse Practitioners) who all work together to provide you with the care you need, when you need  it. ? ?We recommend signing up for the patient portal called "MyChart".  Sign up information is provided on this After Visit Summary.  MyChart is used to connect with patients for Virtual Visits (Telemedicine).  Patients are able to view lab/test results, encounter notes, upcoming appointments, etc.  Non-urgent messages can be sent to your provider as well.   ?To learn more about what you can do with MyChart, go to [email or web address].   ? ?Your next appointment:   ?4 week(s) ? ?The format for your next appointment:   ?In Person ? ?Provider:   ?Norman Herrlich, MD  ? ? ?Other Instructions ?None  ?

## 2021-04-19 ENCOUNTER — Ambulatory Visit: Payer: Medicare HMO

## 2021-04-19 ENCOUNTER — Telehealth: Payer: Self-pay | Admitting: Cardiology

## 2021-04-19 DIAGNOSIS — R0602 Shortness of breath: Secondary | ICD-10-CM

## 2021-04-19 LAB — PRO B NATRIURETIC PEPTIDE: NT-Pro BNP: 116 pg/mL (ref 0–486)

## 2021-04-19 LAB — ECHOCARDIOGRAM COMPLETE
Area-P 1/2: 2.39 cm2
S' Lateral: 3.8 cm

## 2021-04-19 NOTE — Telephone Encounter (Signed)
Results reviewed with pt as per Dr. Munley's note.  Pt verbalized understanding and had no additional questions. Routed to PCP  

## 2021-04-19 NOTE — Telephone Encounter (Signed)
Patient returned call for lab results.  

## 2021-04-28 ENCOUNTER — Ambulatory Visit (HOSPITAL_BASED_OUTPATIENT_CLINIC_OR_DEPARTMENT_OTHER)
Admission: RE | Admit: 2021-04-28 | Discharge: 2021-04-28 | Disposition: A | Discharge status: Home / Self Care | Payer: Medicare HMO | Source: Ambulatory Visit | Attending: Cardiology | Admitting: Cardiology

## 2021-04-28 DIAGNOSIS — J984 Other disorders of lung: Secondary | ICD-10-CM | POA: Diagnosis not present

## 2021-04-28 DIAGNOSIS — U071 COVID-19: Secondary | ICD-10-CM | POA: Diagnosis not present

## 2021-04-28 DIAGNOSIS — R0602 Shortness of breath: Secondary | ICD-10-CM | POA: Diagnosis not present

## 2021-04-28 DIAGNOSIS — R911 Solitary pulmonary nodule: Secondary | ICD-10-CM | POA: Diagnosis not present

## 2021-05-13 NOTE — Progress Notes (Signed)
?Cardiology Office Note:   ? ?Date:  05/15/2021  ? ?ID:  [name] [name], DOB Oct 11, 1940, MRN [id number] ? ?PCP:  Street, Sharon Mt, MD  ?Cardiologist:  Shirlee More, MD   ? ?Referring MD: Street, Sharon Mt, *  ? ? ?ASSESSMENT:   ? ?1. SOB (shortness of breath) on exertion   ?2. Mild CAD   ?3. Essential hypertension   ?4. Mixed hyperlipidemia   ? ?PLAN:   ? ?In order of problems listed above: ? ?Continues to have exertional shortness of breath.  The more time I spent with more than seems to be ischemic in nature.  He is having PFTs done this Wednesday.  We discussed further evaluation he wants to avoid coronary angiography and we will plan on doing a stress perfusion study in my office which should give [place] good information whether or not we need to set up coronary angiography.  I suspect he will be abnormal ?Continue aspirin beta-blocker and his high intensity statin ?Well-controlled continue current treatment ?Continue his high intensity statin lipid profile November 2022 total cholesterol 127 triglycerides 110 ? ? ?Next appointment: 3 months.  If his perfusion study is abnormal I will bring him back to the office and set him up for coronary angiography ? ? ?Medication Adjustments/Labs and Tests Ordered: ?Current medicines are reviewed at length with the patient today.  Concerns regarding medicines are outlined above.  ?No orders of the defined types were placed in this encounter. ? ?No orders of the defined types were placed in this encounter. ? ? ?Chief Complaint  ?Patient presents with  ? Follow-up  ? Shortness of Breath  ? ? ?History of Present Illness:   ? ?[name] [name] is a 81 y.o. male with a hx of mild coronary artery disease cath 08/31/2019 with  50% mid LAD stenosis, COPD, BPH, hyperlipidemia, symptomatic PACs and sleep apnea on CPAP  last seen 04/19/2019 with increasing SOB after COVID infection. He was seen by pulmonary with and elevated D-dimer and subsequent ventilation/perfusion lung scan low  risk and chest x-ray unremarkable.  Pulmonary function test will order.  He has no history of heart failure or valvular heart disease.  Echocardiogram did not show findings of heart failure or pulmonary hypertension his N-terminal proBNP level was quite low and chest CT without contrast did not show pulmonary disease or fibrosis related to COVID-19 infection. ? ?Compliance with diet, lifestyle and medications: Yes ? 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  He also at times has fullness in his epigastrium and nonexertional. ?He does not have edema orthopnea no cough or wheezing. ?We will have PFTs done this week. ? ?I reviewed with him my concern this may be ischemic related to CAD we discussed direct referral to coronary angiography he is hesitant and I will plan to set him up for stress myocardial perfusion study in my office knowing his coronary anatomy we should be able to judge if he is having ischemia in the distribution of his left anterior descending coronary artery that had mild 50% stenosis 2 years ago. ? ? ?Echocardiogram 04/19/2020: ? 1. Left ventricular ejection fraction, by estimation, is 55  to 60%. The  ?left ventricle has normal function. The left ventricle has no regional  ?wall motion abnormalities. Left ventricular diastolic parameters are  ?consistent with Grade I diastolic  ?dysfunction (impaired relaxation). The average left ventricular global  ?longitudinal strain is -19.5 %.  ? 2. Right ventricular systolic function is normal. The right ventricular  ?size is normal. Tricuspid regurgitation signal is inadequate for assessing  ?PA pressure.  ? 3. The mitral valve is normal in  structure. No evidence of mitral valve  ?regurgitation. No evidence of mitral stenosis.  ? 4. The aortic valve is tricuspid. Aortic valve regurgitation is not  ?visualized. No aortic stenosis is present.  ? 5. The inferior vena cava is normal in size with greater than 50%  ?respiratory variability, suggesting right atrial pressure of 3 mmHg.  ? ?Component Ref Range & Units 3 wk ago  ?NT-Pro BNP 0 - 486 pg/mL 116   ? ?CT chest 04/18/2021: ?IMPRESSION: ?1. No acute chest findings or explanation for the patient's ?symptoms. ?2. 5 mm right ground-glass pulmonary nodule. No routine follow-up ?imaging is recommended per [facility]. ?These guidelines do not apply to immunocompromised patients and ?patients with cancer. Follow up in patients with significant ?comorbidities as clinically warranted. For lung cancer screening, ?adhere to Lung-RADS guidelines. Reference: Radiology. 2017; ?284(1):228-43. ?3. Coronary and Aortic Atherosclerosis (ICD10-I70.0). ?Past Medical History:  ?Diagnosis Date  ? Acute prostatitis 12/19/2015  ? Acute respiratory infection   ? Allergic rhinitis, seasonal   ? Apnea, sleep 09/18/2007  ? NPSG 2005:  AHI 54/hr New machine 2014 Auto 2014:  Optimal pressure 20cm, but still with some leak and breakthru events.  Download 2015:  Pressure 18, no significant leaks, AHI 12/hr.   ? 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 Benign prostatic hyperplasia with lower urinary tract symptoms 12/19/2015  ? Breath shortness 07/29/2019  ? Central hearing loss   ? Chest pain   ? Chronic infection of sinus 07/29/2019  ? Chronic kidney disease, stage 3 (HCC)   ? Chronic obstructive pulmonary disease (COPD) ([place])   ? Chronic prostatitis   ? Chronic sinusitis   ? 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 DOE (dyspnea on exertion) 07/22/2012  ? Dyslipidemia   ? Dysuria   ? Elevated PSA   ? Elevated PSA, less than 10 ng/ml   ? Encounter for screening for cardiovascular disorders  07/29/2019  ? Enlarged prostate   ? Esophageal reflux   ? Esophagitis, reflux 07/29/2019  ? Family history of prostate cancer 10/22/2018  ? Flu vaccine need 07/29/2019  ? GERD (gastroesophageal reflux disease)   ? Hemorrhoids   ? Herniated nucleus pulposus, L3-4 left   ? HLD (hyperlipidemia) 07/29/2019  ? Hyperlipidemia   ? Hypertrophy of nasal turbinates 07/29/2019  ? Hypochromic microcytic anemia   ? Internal and external prolapsed hemorrhoids   ? LBP (low back pain) 07/29/2019  ? 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 Lower back pain   ? Lumbar neuritis   ? Nasal turbinate hypertrophy   ? Obstructive sleep apnea 09/18/2007  ? NPSG 2005:  AHI 54/hr New machine 2014 Auto 2014:  Optimal pressure 20cm, but still with some leak and breakthru events.  Download 2015:  Pressure 18, no significant leaks, AHI 12/hr.    ? OSA (obstructive sleep apnea)   ? severe  ? Other long term (current) drug therapy 07/29/2019  ? Overweight   ? Pain of left hand 09/09/2017  ? 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  Peripheral neuropathy   ? Radiculopathy of lumbar region 07/29/2019  ? Reflux esophagitis   ? Seasonal allergic rhinitis   ? Shortness of breath   ? Sleep apnea   ? 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 ? ?Past Surgical History:  ?Procedure Laterality Date  ? APPENDECTOMY  1957  ? COLONOSCOPY  02/10/2013  ? Tubular Adenoma  ? LEFT HEART CATH AND CORONARY ANGIOGRAPHY N/A 08/31/2019  ? Procedure: LEFT HEART CATH AND CORONARY ANGIOGRAPHY;  Surgeon: Burnell Blanks, MD;  Location: [place] [facility];  Service: Cardiovascular;  Laterality: N/A;  ? NASAL SINUS SURGERY    ? SPINE SURGERY    ? ? ?Current Medications: ?Current Meds  ?Medication Sig  ? acetaminophen (TYLENOL) 500 MG tablet Take 500 mg by mouth at bedtime.  ? albuterol (VENTOLIN HFA) 108 (90 Base) MCG/ACT inhaler Inhale 2 puffs into the lungs every 6 (six) hours as needed for wheezing or shortness of breath.  ? aspirin EC 81 MG tablet Take 1 tablet (81 mg  total) by mouth daily. Swallow whole.  ? Calcium Polycarbophil (KONSYL FIBER PO) Take 1 Dose by mouth daily. 1 teaspoon  ? cholecalciferol (VITAMIN D) 25 MCG (1000 UNIT) tablet Take 1,000 Units by mouth daily.  ?

## 2021-05-15 ENCOUNTER — Ambulatory Visit: Payer: Medicare HMO | Admitting: Cardiology

## 2021-05-15 ENCOUNTER — Encounter: Payer: Self-pay | Admitting: Cardiology

## 2021-05-15 VITALS — BP 126/70 | HR 56 | Ht 64.0 in | Wt 176.0 lb

## 2021-05-15 DIAGNOSIS — R0602 Shortness of breath: Secondary | ICD-10-CM

## 2021-05-15 DIAGNOSIS — I10 Essential (primary) hypertension: Secondary | ICD-10-CM

## 2021-05-15 DIAGNOSIS — I2510 Atherosclerotic heart disease of native coronary artery without angina pectoris: Secondary | ICD-10-CM

## 2021-05-15 DIAGNOSIS — E782 Mixed hyperlipidemia: Secondary | ICD-10-CM | POA: Diagnosis not present

## 2021-05-15 DIAGNOSIS — E538 Deficiency of other specified B group vitamins: Secondary | ICD-10-CM | POA: Diagnosis not present

## 2021-05-15 NOTE — Patient Instructions (Signed)
Medication Instructions:  ?Your physician recommends that you continue on your current medications as directed. Please refer to the Current Medication list given to you today. ? ?*If you need a refill on your cardiac medications before your next appointment, please call your pharmacy* ? ? ?Lab Work: ?NONE ?If you have labs (blood work) drawn today and your tests are completely normal, you will receive your results only by: ?MyChart Message (if you have MyChart) OR ?A paper copy in the mail ?If you have any lab test that is abnormal or we need to change your treatment, we will call you to review the results. ? ? ?Testing/Procedures: ?Your physician has requested that you have a lexiscan myoview. For further information please visit [email or web address] Please follow instruction sheet, as given. ? ? ?The test will take approximately 3 to 4 hours to complete; you may bring reading material.  If someone comes with you to your appointment, they will need to remain in the main lobby due to limited space in the testing area.Erectile dysfunction medications should be held 48 hrs prior to testing  ?How to prepare for your Myocardial Perfusion Test: ?Do not eat or drink 3 hours prior to your test, except you may have water. ?Do not consume products containing caffeine (regular or decaffeinated) 12 hours prior to your test. (ex: coffee, chocolate, sodas, tea). ?Do bring a list of your current medications with you.  If not listed below, you may take your medications as normal. ?Do wear comfortable clothes (no dresses or overalls) and walking shoes, tennis shoes preferred (No heels or open toe shoes are allowed). ?Do NOT wear cologne, perfume, aftershave, or lotions (deodorant is allowed). ?If these instructions are not followed, your test will have to be rescheduled. ? ? ? ?Follow-Up: ?At [facility], you and your health needs are our priority.  As part of our continuing mission to provide you with exceptional heart care, we  have created designated Provider Care Teams.  These Care Teams include your primary Cardiologist (physician) and Advanced Practice Providers (APPs -  Physician Assistants and Nurse Practitioners) who all work together to provide you with the care you need, when you need it. ? ?We recommend signing up for the patient portal called "MyChart".  Sign up information is provided on this After Visit Summary.  MyChart is used to connect with patients for Virtual Visits (Telemedicine).  Patients are able to view lab/test results, encounter notes, upcoming appointments, etc.  Non-urgent messages can be sent to your provider as well.   ?To learn more about what you can do with MyChart, go to [email or web address].   ? ?Your next appointment:   ?3 month(s) ? ?The format for your next appointment:   ?In Person ? ?Provider:   ?Norman Herrlich, MD  ? ? ?Other Instructions ? ? ?Important Information About Sugar ? ? ? ? ?  ?

## 2021-05-16 ENCOUNTER — Telehealth (HOSPITAL_COMMUNITY): Payer: Self-pay | Admitting: *Deleted

## 2021-05-16 ENCOUNTER — Ambulatory Visit: Payer: Medicare HMO | Admitting: Pulmonary Disease

## 2021-05-16 ENCOUNTER — Encounter (HOSPITAL_COMMUNITY): Payer: Self-pay | Admitting: *Deleted

## 2021-05-16 NOTE — Telephone Encounter (Signed)
Left message on voicemail in reference to upcoming appointment scheduled for 05/23/21. Phone number given for a call back so details instructions can be given.  Letter with instructions sent to my chart.  [name]\ ? ?

## 2021-05-17 ENCOUNTER — Ambulatory Visit (INDEPENDENT_AMBULATORY_CARE_PROVIDER_SITE_OTHER): Payer: Medicare HMO | Admitting: Pulmonary Disease

## 2021-05-17 ENCOUNTER — Encounter: Payer: Self-pay | Admitting: Nurse Practitioner

## 2021-05-17 ENCOUNTER — Ambulatory Visit: Payer: Medicare HMO | Admitting: Nurse Practitioner

## 2021-05-17 VITALS — BP 120/80 | HR 55 | Temp 98.0°F | Ht 65.0 in | Wt 175.4 lb

## 2021-05-17 DIAGNOSIS — J4531 Mild persistent asthma with (acute) exacerbation: Secondary | ICD-10-CM

## 2021-05-17 DIAGNOSIS — Z8616 Personal history of COVID-19: Secondary | ICD-10-CM

## 2021-05-17 DIAGNOSIS — G4733 Obstructive sleep apnea (adult) (pediatric): Secondary | ICD-10-CM | POA: Diagnosis not present

## 2021-05-17 DIAGNOSIS — Z9989 Dependence on other enabling machines and devices: Secondary | ICD-10-CM

## 2021-05-17 DIAGNOSIS — R0609 Other forms of dyspnea: Secondary | ICD-10-CM | POA: Diagnosis not present

## 2021-05-17 DIAGNOSIS — J301 Allergic rhinitis due to pollen: Secondary | ICD-10-CM | POA: Diagnosis not present

## 2021-05-17 HISTORY — DX: Mild persistent asthma with (acute) exacerbation: J45.31

## 2021-05-17 LAB — PULMONARY FUNCTION TEST
DL/VA % pred: 88 %
DL/VA: 3.47 ml/min/mmHg/L
DLCO cor % pred: 99 %
DLCO cor: 20.33 ml/min/mmHg
DLCO unc % pred: 97 %
DLCO unc: 19.86 ml/min/mmHg
FEF 25-75 Post: 1.71 L/sec
FEF 25-75 Pre: 1.41 L/sec
FEF2575-%Change-Post: 21 %
FEF2575-%Pred-Post: 124 %
FEF2575-%Pred-Pre: 102 %
FEV1-%Change-Post: 4 %
FEV1-%Pred-Post: 126 %
FEV1-%Pred-Pre: 120 %
FEV1-Post: 2.69 L
FEV1-Pre: 2.57 L
FEV1FVC-%Change-Post: 4 %
FEV1FVC-%Pred-Pre: 95 %
FEV6-%Change-Post: 0 %
FEV6-%Pred-Post: 131 %
FEV6-%Pred-Pre: 130 %
FEV6-Post: 3.71 L
FEV6-Pre: 3.69 L
FEV6FVC-%Change-Post: 0 %
FEV6FVC-%Pred-Post: 106 %
FEV6FVC-%Pred-Pre: 106 %
FVC-%Change-Post: 0 %
FVC-%Pred-Post: 123 %
FVC-%Pred-Pre: 122 %
FVC-Post: 3.8 L
FVC-Pre: 3.78 L
Post FEV1/FVC ratio: 71 %
Post FEV6/FVC ratio: 98 %
Pre FEV1/FVC ratio: 68 %
Pre FEV6/FVC Ratio: 98 %
RV % pred: 100 %
RV: 2.46 L
TLC % pred: 98 %
TLC: 5.97 L

## 2021-05-17 LAB — NITRIC OXIDE: Nitric Oxide: 40

## 2021-05-17 MED ORDER — ADVAIR HFA 115-21 MCG/ACT IN AERO: 2.0000 | INHALATION_SPRAY | Freq: Two times a day (BID) | RESPIRATORY_TRACT | 12 refills | Status: DC

## 2021-05-17 MED ORDER — PREDNISONE 20 MG PO TABS: 20.0000 mg | ORAL_TABLET | Freq: Every day | ORAL | 0 refills | Status: AC

## 2021-05-17 NOTE — Assessment & Plan Note (Signed)
Excellent compliance. Continue to receive good benefit. Does have breakthrough OSA of 12.3 on 16 cmH2O, which is decreased from 14.7 on setting of 15. Will trial increase to 18cmH2O. If still having persistent breakthrough and residual fatigue symptoms, may need possible BiPAP therapy.  ?

## 2021-05-17 NOTE — Progress Notes (Signed)
Reviewed and agree with assessment/plan. ? ? ?[name], MD ?[place] ?05/17/2021, 5:28 PM ?Pager:  [phone] ? ?

## 2021-05-17 NOTE — Progress Notes (Signed)
PFT done today. 

## 2021-05-17 NOTE — Progress Notes (Signed)
? ?@Patient  ID: [name] [name] [name], male    DOB: 07-Mar-1940, 81 y.o.   MRN: [id number] ? ?Chief Complaint  ?Patient presents with  ? Follow-up  ?  He is about the same with shortness of breath with exertion and had PFT today.   ? ? ?Referring provider: ?[name] [name], [name] Mt, * ? ?HPI: ?81 year old male, former smoker followed for obstructive sleep apnea on CPAP and DOE.  He is a patient of Dr. Juanetta [name] [name] and last seen in office 04/04/2021.  Past medical history significant for GERD, neuropathy, HLD, BPH, nonobstructive CAD, hypertension, paroxysmal SVT, seasonal allergic rhinitis, CKD stage III. ? ?TEST/EVENTS:  ?04/04/2021: Eosinophils 0.3,IgE normal, D-dimer 1.51, BNP normal ?04/19/2021 Echocardiogram: EF 55 to 60%.  G1 DD.  RV size and function is normal.  Unable to assess PASP.  No valvular dysfunction noted. ?04/28/2021 CT chest without contrast: There is atherosclerosis present.  There are no enlarged lymph nodes.  There is mild linear scarring at both lung bases.  There is a 5 mm groundglass nodule in the right middle lobe.  Otherwise lungs are clear without acute process. ? ?04/04/2021: OV with Dr. Halford [name] [name].  Excellent compliance with CPAP.  Continue to receive good benefit.  Did have some breakthrough OSA with AHI 14.7 on CPAP 15 cmH2O.  Increased to 16 cm water.  Patient complained of worsening DOE after having COVID around Thanksgiving.  Felt [name] [name] his activity tolerance had significantly declined and he felt he was getting fatigued more often.  D-dimer was elevated so pulmonary perfusion scan was obtained which was negative for PE.  Chest x-ray without acute process.  Slight elevation in eosinophils on CBC with differential.  Advised to try albuterol as needed.  PFTs ordered for further evaluation.  Also awaiting cardiology assessment a couple weeks. ? ?05/17/2021: Today-follow-up ?Patient presents today for follow-up after undergoing pulmonary function testing.  Overall his testing was unremarkable; however he did have some  reversibility in his mid flow airways.  He reports that he continues to experience shortness of breath upon exertion.  Still unable to walk long distances or exercise without feeling winded.  Does not feel [name] [name] he has had any improvement nor worsening since being seen last.  Was worked up by cardiology without any significant findings; does have G1 DD.  Denies noticing any wheezing, lower extremity swelling, palpitations.  No recent weight loss or anorexia.  Did not notice a significant change with the albuterol.  Does occasionally have some allergy type symptoms and is using Flonase currently.  Continues to use CPAP nightly with good benefit.  Denies morning headache or drowsy driving.  He does continue to have some residual daytime fatigue. ? ?AirView Download: 04/16/2021-05/15/2021 ?30/30 days (100% >4 hr), Av usage 7 hr 19 min ?Leaks median 3.9, 95th 18.9 ?AHI 12.3 ? ?Allergies  ?Allergen Reactions  ? Penicillins Other (See Comments)  ?  Does not work for pt (ineffective) ?  ? ? ?Immunization History  ?Administered Date(s) Administered  ? Influenza Split 10/30/2011, 10/21/2016  ? Influenza, High Dose Seasonal PF 11/12/2017  ? Influenza,inj,Quad PF,6+ Mos 10/29/2012, 10/30/2014, 11/30/2015  ? PFIZER(Purple Top)SARS-COV-2 Vaccination 02/11/2019, 03/04/2019  ? Pneumococcal Conjugate-13 01/29/2006  ? ? ?Past Medical History:  ?Diagnosis Date  ? Acute prostatitis 12/19/2015  ? Acute respiratory infection   ? Allergic rhinitis, seasonal   ? Apnea, sleep 09/18/2007  ? NPSG 2005:  AHI 54/hr New machine 2014 Auto 2014:  Optimal pressure 20cm, but still with some leak and breakthru events.  Download  2015:  Pressure 18, no significant leaks, AHI 12/hr.   ? 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 Benign prostatic hyperplasia with lower urinary tract symptoms 12/19/2015  ? Breath shortness 07/29/2019  ? Central hearing loss   ? Chest pain   ? Chronic infection of sinus 07/29/2019  ? Chronic kidney disease, stage 3 (HCC)   ? Chronic  obstructive pulmonary disease (COPD) ([place])   ? Chronic prostatitis   ? Chronic sinusitis   ? 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 DOE (dyspnea on exertion) 07/22/2012  ? Dyslipidemia   ? Dysuria   ? Elevated PSA   ? Elevated PSA, less than 10 ng/ml   ? Encounter for screening for cardiovascular disorders 07/29/2019  ? Enlarged prostate   ? Esophageal reflux   ? Esophagitis, reflux 07/29/2019  ? Family history of prostate cancer 10/22/2018  ? Flu vaccine need 07/29/2019  ? GERD (gastroesophageal reflux disease)   ? Hemorrhoids   ? Herniated nucleus pulposus, L3-4 left   ? HLD (hyperlipidemia) 07/29/2019  ? Hyperlipidemia   ? Hypertrophy of nasal turbinates 07/29/2019  ? Hypochromic microcytic anemia   ? Internal and external prolapsed hemorrhoids   ? LBP (low back pain) 07/29/2019  ? 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 Lower back pain   ? Lumbar neuritis   ? Nasal turbinate hypertrophy   ? Obstructive sleep apnea 09/18/2007  ? NPSG 2005:  AHI 54/hr New machine 2014 Auto 2014:  Optimal pressure 20cm, but still with some leak and breakthru events.  Download 2015:  Pressure 18, no significant leaks, AHI 12/hr.    ? OSA (obstructive sleep apnea)   ? severe  ? Other long term (current) drug therapy 07/29/2019  ? Overweight   ? Pain of left hand 09/09/2017  ? 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 Peripheral neuropathy   ? Radiculopathy of lumbar region 07/29/2019  ? Reflux esophagitis   ? Seasonal allergic rhinitis   ? Shortness of breath   ? Sleep apnea   ? 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 ? ?Tobacco History: ?Social History  ? ?Tobacco Use  ?Smoking Status Former  ? Packs/day: 1.00  ? Years: 12.00  ? Pack years: 12.00  ? Types: Cigarettes  ? Start date: 60  ? Quit date: 01/30/1964  ? Years since quitting: 57.3  ? Passive exposure: Past  ?Smokeless Tobacco Never  ? ?Counseling given: Not Answered ? ? ?Outpatient Medications Prior to Visit  ?Medication Sig Dispense Refill   ? acetaminophen (TYLENOL) 500 MG tablet Take 500 mg by mouth at bedtime.    ? albuterol (VENTOLIN HFA) 108 (90 Base) MCG/ACT inhaler Inhale 2 puffs into the lungs every 6 (six) hours as needed for wheezing or shortness of breath. 8 g 5  ? aspirin EC 81 MG tablet Take 1 tablet (81 mg total) by mouth daily. Swallow whole. 90 tablet 3  ? Calcium Polycarbophil (KONSYL FIBER PO) Take 1 Dose by mouth daily. 1 teaspoon    ? cholecalciferol (VITAMIN D) 25 MCG (1000 UNIT) tablet Take 1,000 Units by mouth daily.    ? Cyanocobalamin (B-12 IJ) Inject into the muscle.    ? fluticasone (FLONASE) 50 MCG/ACT nasal spray Place 1-2 sprays into both nostrils daily as needed for allergies.     ? metoprolol succinate (TOPROL-XL) 25 MG 24 hr tablet Take 1/2 (one-half) tablet by mouth once daily 45 tablet 2  ? naproxen sodium (ALEVE) 220 MG tablet  Take 220 mg by mouth 2 (two) times daily as needed (back pain.).     ? pantoprazole (PROTONIX) 40 MG tablet Take 1 tablet (40 mg total) by mouth daily. 30 tablet 2  ? tamsulosin (FLOMAX) 0.4 MG CAPS capsule Take 0.4 mg by mouth at bedtime.     ? nitroGLYCERIN (NITROSTAT) 0.4 MG SL tablet Place 1 tablet (0.4 mg total) under the tongue every 5 (five) minutes as needed. 25 tablet 6  ? rosuvastatin (CRESTOR) 10 MG tablet Take 1 tablet (10 mg total) by mouth daily. 30 tablet 5  ? ?No facility-administered medications prior to visit.  ? ? ? ?Review of Systems:  ? ?Constitutional: No weight loss or gain, night sweats, fevers, chills +fatigue. ?HEENT: No headaches, difficulty swallowing, tooth/dental problems, or sore throat. No itching, ear ache. + Intermittent nasal congestion/drainage; occasional sneezing ?CV:  No chest pain, orthopnea, PND, swelling in lower extremities, anasarca, dizziness, palpitations, syncope ?Resp: +shortness of breath with exertion; minimal dry cough. No excess mucus or change in color of mucus. No hemoptysis. No wheezing.  No chest wall deformity ?GI:  No heartburn,  indigestion, abdominal pain, nausea, vomiting, diarrhea, change in bowel habits, loss of appetite, bloody stools.  ?GU: No dysuria, change in color of urine, urgency or frequency.  No flank pain, no hematuria

## 2021-05-17 NOTE — Progress Notes (Signed)
Reviewed and agree with assessment/plan. ? ? ?Coralyn Helling, MD ?[facility] ?05/17/2021, 3:40 PM ?Pager:  [phone] ? ?

## 2021-05-17 NOTE — Assessment & Plan Note (Addendum)
No history of asthma.  Did have some mid flow reversibility on PFTs (21% change).  Due to this and symptoms, FeNO was obtained which was elevated to 40 ppb.  He also does have an elevation in his eosinophils on previous testing.  Given evidence of minimal bronchospasm on exam and elevated nitric oxide, will treat with prednisone burst.  Start ICS/LABA therapy with Advair.  Advised to continue using albuterol as needed.  Can use 15 minutes prior to activity and see if this makes a difference in his symptoms.  May consider Singulair in future for eosinophilia. ? ?Patient Instructions  ?Continue Albuterol inhaler 2 puffs every 6 hours as needed for shortness of breath or wheezing. Notify if symptoms persist despite rescue inhaler/neb use. ?Continue fluticasone 1-2 sprays each nostril daily  ?Continue protonix 40 mg daily  ? ?Start Advair 2 puffs Twice daily. Brush tongue and rinse mouth afterwards ?Prednisone 20 mg for 5 days. Take in AM with food ?Starting Claritin 10 mg daily over the counter for allergies  ? ?Continue to use CPAP every night, minimum of 4-6 hours a night.  ?Change equipment every 30 days or as directed by DME. Wash your tubing with warm soap and water daily, hang to dry. Wash humidifier portion weekly.  ?Be aware of reduced alertness and do not drive or operate heavy machinery if experiencing this or drowsiness.  ?Healthy weight management discussed.  ?Avoid or decrease alcohol consumption and medications that make you more sleepy, if possible. ?Notify if persistent daytime sleepiness occurs even with consistent use of CPAP. ? ?Follow up in one month with Dr. Craige Cotta or Philis Nettle. If symptoms do not improve or worsen, please contact office for sooner follow up or seek emergency care. ? ? ?

## 2021-05-17 NOTE — Assessment & Plan Note (Signed)
Advised starting Claritin or over-the-counter antihistamine for trigger prevention.  Continue Flonase for postnasal drainage control ?

## 2021-05-17 NOTE — Patient Instructions (Addendum)
Continue Albuterol inhaler 2 puffs every 6 hours as needed for shortness of breath or wheezing. Notify if symptoms persist despite rescue inhaler/neb use. ?Continue fluticasone 1-2 sprays each nostril daily  ?Continue protonix 40 mg daily  ? ?Start Advair 2 puffs Twice daily. Brush tongue and rinse mouth afterwards ?Prednisone 20 mg for 5 days. Take in AM with food ?Starting Claritin 10 mg daily over the counter for allergies  ? ?Continue to use CPAP every night, minimum of 4-6 hours a night.  ?Change equipment every 30 days or as directed by DME. Wash your tubing with warm soap and water daily, hang to dry. Wash humidifier portion weekly.  ?Be aware of reduced alertness and do not drive or operate heavy machinery if experiencing this or drowsiness.  ?Healthy weight management discussed.  ?Avoid or decrease alcohol consumption and medications that make you more sleepy, if possible. ?Notify if persistent daytime sleepiness occurs even with consistent use of CPAP. ? ?Follow up in one month with Dr. Craige Cotta or Philis Nettle. If symptoms do not improve or worsen, please contact office for sooner follow up or seek emergency care. ?

## 2021-05-18 ENCOUNTER — Telehealth: Payer: Self-pay | Admitting: Cardiology

## 2021-05-18 NOTE — Telephone Encounter (Signed)
The patient went to pulmonary yesterday and they started him on asthma medication, patient states he would like to know if Dr. Dulce Sellar still wants him to have test done on Tuesday (Stress Test) because patient doesn't think he should be on the treadmill with "all that's going on" or if Dr Dulce Sellar would like him to do it without the treadmill. ?

## 2021-05-18 NOTE — Telephone Encounter (Signed)
Called patient and informed him of Dr Hulen Shouts note along with rescheduling test. [name] [name] 05/18/21 ?

## 2021-05-18 NOTE — Telephone Encounter (Signed)
Went to pulmonary yesterday and they started him on asthma medication, patient states he would like to know if Dr. Dulce Sellar still wants him to have test done on Tuesday (Stress Test) because patient doesn't think he should be on the treadmill with "all that's going on" or if Dr Dulce Sellar would like him to do it without the treadmill. ? ?Best number [phone] ?

## 2021-05-30 ENCOUNTER — Other Ambulatory Visit: Payer: Self-pay | Admitting: Cardiology

## 2021-05-30 ENCOUNTER — Telehealth (HOSPITAL_COMMUNITY): Payer: Self-pay | Admitting: *Deleted

## 2021-05-30 ENCOUNTER — Encounter (HOSPITAL_COMMUNITY): Payer: Self-pay | Admitting: *Deleted

## 2021-05-30 NOTE — Telephone Encounter (Signed)
Attempted to call patient regarding upcoming appointment- no answer, unable to leave a message.  Letter sent via mychart.  [name] [name]  

## 2021-06-06 ENCOUNTER — Ambulatory Visit (INDEPENDENT_AMBULATORY_CARE_PROVIDER_SITE_OTHER): Payer: Medicare HMO

## 2021-06-06 DIAGNOSIS — R0602 Shortness of breath: Secondary | ICD-10-CM

## 2021-06-06 DIAGNOSIS — E782 Mixed hyperlipidemia: Secondary | ICD-10-CM

## 2021-06-06 DIAGNOSIS — I10 Essential (primary) hypertension: Secondary | ICD-10-CM | POA: Diagnosis not present

## 2021-06-06 DIAGNOSIS — I2510 Atherosclerotic heart disease of native coronary artery without angina pectoris: Secondary | ICD-10-CM | POA: Diagnosis not present

## 2021-06-06 MED ORDER — TECHNETIUM TC 99M TETROFOSMIN IV KIT
31.8000 | PACK | Freq: Once | INTRAVENOUS | Status: AC | PRN
  Administered 2021-06-06: 31.8 via INTRAVENOUS

## 2021-06-06 MED ORDER — TECHNETIUM TC 99M TETROFOSMIN IV KIT
10.9000 | PACK | Freq: Once | INTRAVENOUS | Status: AC | PRN
Start: 2021-06-06 — End: 2021-06-06
  Administered 2021-06-06: 10.9 via INTRAVENOUS

## 2021-06-07 LAB — MYOCARDIAL PERFUSION IMAGING
Angina Index: 0
Duke Treadmill Score: 4
Estimated workload: 7.7
Exercise duration (min): 3 min
Exercise duration (sec): 31 s
LV dias vol: 38 mL (ref 62–150)
LV sys vol: 93 mL
MPHR: 137 {beats}/min
Nuc Stress EF: 59 %
Peak HR: 136 {beats}/min
Percent HR: 99 %
Rest HR: 50 {beats}/min
Rest Nuclear Isotope Dose: 10.9 mCi
SDS: 2
SRS: 0
SSS: 2
ST Depression (mm): 0 mm
Stress Nuclear Isotope Dose: 31.8 mCi
TID: 0.97

## 2021-06-19 ENCOUNTER — Ambulatory Visit: Payer: Medicare HMO | Admitting: Nurse Practitioner

## 2021-06-19 ENCOUNTER — Encounter: Payer: Self-pay | Admitting: Nurse Practitioner

## 2021-06-19 VITALS — BP 116/74 | HR 51 | Temp 98.0°F | Ht 64.0 in | Wt 175.0 lb

## 2021-06-19 DIAGNOSIS — G4733 Obstructive sleep apnea (adult) (pediatric): Secondary | ICD-10-CM | POA: Diagnosis not present

## 2021-06-19 DIAGNOSIS — J4531 Mild persistent asthma with (acute) exacerbation: Secondary | ICD-10-CM | POA: Diagnosis not present

## 2021-06-19 DIAGNOSIS — Z9989 Dependence on other enabling machines and devices: Secondary | ICD-10-CM | POA: Diagnosis not present

## 2021-06-19 LAB — NITRIC OXIDE: Nitric Oxide: 39

## 2021-06-19 MED ORDER — FLUTICASONE-SALMETEROL 230-21 MCG/ACT IN AERO: 2.0000 | INHALATION_SPRAY | Freq: Two times a day (BID) | RESPIRATORY_TRACT | 5 refills | Status: DC

## 2021-06-19 MED ORDER — PREDNISONE 10 MG PO TABS: ORAL_TABLET | ORAL | 0 refills | Status: DC

## 2021-06-19 NOTE — Patient Instructions (Addendum)
Step up Advair 2 puffs Twice daily. Brush tongue and rinse mouth afterwards Continue Albuterol inhaler 2 puffs every 6 hours as needed for shortness of breath or wheezing. Notify if symptoms persist despite rescue inhaler/neb use. Continue fluticasone 1-2 sprays each nostril daily  Continue protonix 40 mg daily  Continue Claritin 10 mg daily over the counter for allergies   Prednisone taper. 4 tabs for 2 days, then 3 tabs for 2 days, 2 tabs for 2 days, then 1 tab for 2 days, then stop. Take in AM with food.    Continue to use CPAP every night, minimum of 4-6 hours a night.  CPAP titration study ordered to evaluate need for possible BiPAP therapy.    Follow up after CPAP titration with Dr. Craige Cotta or Katie Eugene Zeiders,NP. If symptoms do not improve or worsen, please contact office for sooner follow up or seek emergency care.

## 2021-06-19 NOTE — Progress Notes (Signed)
@Patient  ID: [name] [name] [name], male    DOB: [birth year], 81 y.o.   MRN: [id number]  Chief Complaint  Patient presents with   Follow-up    He is still doing ok and feel that sometimes his CPAP can be too much air.     Referring provider: Street, [place], *  HPI: 81 year old male, former smoker followed for obstructive sleep apnea on CPAP and DOE.  He is a patient of Dr. Juanetta Gosling and last seen in office 04/04/2021.  Past medical history significant for GERD, neuropathy, HLD, BPH, nonobstructive CAD, hypertension, paroxysmal SVT, seasonal allergic rhinitis, CKD stage III.  TEST/EVENTS:  04/04/2021: Eosinophils 0.3,IgE normal, D-dimer 1.51, BNP normal 04/19/2021 Echocardiogram: EF 55 to 60%.  G1 DD.  RV size and function is normal.  Unable to assess PASP.  No valvular dysfunction noted. 04/28/2021 CT chest without contrast: There is atherosclerosis present.  There are no enlarged lymph nodes.  There is mild linear scarring at both lung bases.  There is a 5 mm groundglass nodule in the right middle lobe.  Otherwise lungs are clear without acute process.  04/04/2021: OV with Dr. Halford Chessman.  Excellent compliance with CPAP.  Continue to receive good benefit.  Did have some breakthrough OSA with AHI 14.7 on CPAP 15 cmH2O.  Increased to 16 cm water.  Patient complained of worsening DOE after having COVID around Thanksgiving.  Felt [name] [name] his activity tolerance had significantly declined and he felt he was getting fatigued more often.  D-dimer was elevated so pulmonary perfusion scan was obtained which was negative for PE.  Chest x-ray without acute process.  Slight elevation in eosinophils on CBC with differential.  Advised to try albuterol as needed.  PFTs ordered for further evaluation.  Also awaiting cardiology assessment a couple weeks.  05/17/2021: OV with Irby Fails NP for follow-up after undergoing pulmonary function testing.  Overall his testing was unremarkable; however he did have some reversibility in his mid  flow airways.  He also has a history of elevated eosinophils. He reports that he continues to experience shortness of breath upon exertion.  Still unable to walk long distances or exercise without feeling winded.  Does not feel [name] [name] he has had any improvement nor worsening since being seen last.  Was worked up by cardiology without any significant findings; does have G1 DD.  Does occasionally have some allergy type symptoms and is using Flonase currently.  FeNO elevated at 40 ppb. Treated with prednisone burst. Started on Advair Twice daily. Continues to use CPAP nightly with good benefit.  Denies morning headache or drowsy driving.  He does continue to have some residual daytime fatigue and breakthrough AHI. Increased to 18 cmH2O   06/19/2021: Today - follow up Patient presents today for follow up. He reports that he feels a little bit better on Advair and doesn't feel [name] [name] he is as short of breath with exertion. He is still having some rare wheezing and morning chest tightness as well as some PND. He feels [name] [name] his CPAP pressure may be too high but he's not sure if that is what's causing his symptoms. He denies any wheezing or cough. He has had a full cardiac workup that was unremarkable. He is using his Advair Twice daily and rarely uses albuterol. He also feels [name] [name] his allergy symptoms have improved some with addition of Claritin.   05/17/2021-06/15/2021 AirView Download 18 cmH2O  30/30 days used; 100% >4 hr; av usage 7 hr 19 min Leaks median 6.4, 95th 22.1  AHI 9.4   Allergies  Allergen Reactions   Penicillins Other (See Comments)    Does not work for pt (ineffective)     Immunization History  Administered Date(s) Administered   Influenza Split 10/30/2011, 10/21/2016   Influenza, High Dose Seasonal PF 11/12/2017   Influenza,inj,Quad PF,6+ Mos 10/29/2012, 10/30/2014, 11/30/2015   PFIZER(Purple Top)SARS-COV-2 Vaccination 02/11/2019, 03/04/2019   Pneumococcal Conjugate-13 01/29/2006    Past  Medical History:  Diagnosis Date   Acute prostatitis 12/19/2015   Acute respiratory infection    Allergic rhinitis, seasonal    Apnea, sleep 09/18/2007   NPSG 2005:  AHI 54/hr New machine 2014 Auto 2014:  Optimal pressure 20cm, but still with some leak and breakthru events.  Download 2015:  Pressure 18, no significant leaks, AHI 12/hr.    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   DOE (dyspnea on exertion) 07/22/2012   Dyslipidemia    Dysuria    Elevated PSA    Elevated PSA, less than 10 ng/ml    Encounter for screening for cardiovascular disorders 07/29/2019   Enlarged prostate    Esophageal reflux    Esophagitis, reflux 07/29/2019   Family history of prostate cancer 10/22/2018   Flu vaccine need 07/29/2019   GERD (gastroesophageal reflux disease)    Hemorrhoids    Herniated nucleus pulposus, L3-4 left    HLD (hyperlipidemia) 07/29/2019   Hyperlipidemia    Hypertrophy of nasal turbinates 07/29/2019   Hypochromic microcytic anemia    Internal and external prolapsed hemorrhoids    LBP (low back pain) 07/29/2019   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  Download 2015:  Pressure 18, no significant leaks, AHI 12/hr.     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     albuterol (VENTOLIN HFA) 108 (90 Base) MCG/ACT inhaler Inhale 2 puffs into the lungs every 6 (six) hours as needed for wheezing or  shortness of breath. 8 g 5   aspirin EC 81 MG tablet Take 1 tablet (81 mg total) by mouth daily. Swallow whole. 90 tablet 3   Calcium Polycarbophil (KONSYL FIBER PO) Take 1 Dose by mouth daily. 1 teaspoon     cholecalciferol (VITAMIN D) 25 MCG (1000 UNIT) tablet Take 1,000 Units by mouth daily.     Cyanocobalamin (B-12 IJ) Inject into the muscle.     fluticasone (FLONASE) 50 MCG/ACT nasal spray Place 1-2 sprays into both nostrils daily as needed for allergies.      metoprolol succinate (TOPROL-XL) 25 MG 24 hr tablet Take 1/2 (one-half) tablet by mouth once daily 45 tablet 2   naproxen sodium (ALEVE) 220 MG tablet Take 220 mg by mouth 2 (two) times daily as needed (back pain.).      pantoprazole (PROTONIX) 40 MG tablet Take 1 tablet (40 mg total) by mouth daily. 30 tablet 2   tamsulosin (FLOMAX) 0.4 MG CAPS capsule Take 0.4 mg by mouth at bedtime.      fluticasone-salmeterol (ADVAIR HFA) 115-21 MCG/ACT inhaler Inhale 2 puffs into the lungs 2 (two) times daily. 1 each 12   nitroGLYCERIN (NITROSTAT) 0.4 MG SL tablet Place 1 tablet (0.4 mg total) under the tongue every 5 (five) minutes as  needed. 25 tablet 6   rosuvastatin (CRESTOR) 10 MG tablet Take 1 tablet (10 mg total) by mouth daily. 30 tablet 5   No facility-administered medications prior to visit.     Review of Systems:   Constitutional: No weight loss or gain, night sweats, fevers, chills +daytime fatigue. HEENT: No headaches, difficulty swallowing, tooth/dental problems, or sore throat. No itching, ear ache, sneezing, nasal congestion CV:  +PND. No chest pain, orthopnea, swelling in lower extremities, anasarca, dizziness, palpitations, syncope Resp: +AM chest tightness, shortness of breath with exertion (improved); rare wheeze. No cough. No excess mucus or change in color of mucus. No hemoptysis. No wheezing.  No chest wall deformity GI:  No heartburn, indigestion, abdominal pain, nausea, vomiting, diarrhea, change in bowel habits, loss of appetite, bloody stools.  GU: No dysuria, change in color of urine, urgency or frequency.  No flank pain, no hematuria  Skin: No rash, lesions, ulcerations MSK:  No joint pain or swelling.  No decreased range of motion.  No back pain. Neuro: No dizziness or lightheadedness.  Psych: No depression or anxiety. Mood stable.     Physical Exam:  BP 116/74 (BP Location: Right Arm, Patient Position: Sitting, Cuff Size: Normal)   Pulse (!) 51   Temp 98 F (36.7 C) (Oral)   Ht 5\' 4"  (1.626 m)   Wt 175 lb (79.4 kg)   SpO2 99%   BMI 30.04 kg/m   GEN: Pleasant, interactive, well-appearing; in no acute distress. HEENT:  Normocephalic and atraumatic. PERRLA. Sclera white. Nasal turbinates pink, moist and patent bilaterally. No rhinorrhea present. Oropharynx pink and moist, without exudate or edema. No lesions, ulcerations, or postnasal drip.  NECK:  Supple w/ fair ROM. No JVD present. Normal carotid impulses w/o bruits. Thyroid symmetrical with no goiter or nodules palpated. No lymphadenopathy.   CV: RRR, no m/r/g, no peripheral edema. Pulses intact, +2 bilaterally. No cyanosis,  pallor or clubbing. PULMONARY:  Unlabored, regular breathing.  Scattered minimal end expiratory wheezes bilaterally A&P. No accessory muscle use. No dullness to percussion. GI: BS present and normoactive. Soft, non-tender to palpation. No organomegaly or masses detected. No CVA tenderness. MSK: No erythema, warmth or tenderness. Cap refil <2 sec all  extrem. No deformities or joint swelling noted.  Neuro: A/Ox3. No focal deficits noted.   Skin: Warm, no lesions or rashe Psych: Normal affect and behavior. Judgement and thought content appropriate.     Lab Results:  CBC    Component Value Date/Time   WBC 5.0 04/04/2021 1045   RBC 4.47 04/04/2021 1045   HGB 13.8 04/04/2021 1045   HGB 14.0 08/20/2019 1242   HCT 40.3 04/04/2021 1045   HCT 41.9 08/20/2019 1242   PLT 124.0 (L) 04/04/2021 1045   PLT 142 (L) 08/20/2019 1242   MCV 90.3 04/04/2021 1045   MCV 90 08/20/2019 1242   MCH 30.1 08/20/2019 1242   MCH 30.3 10/04/2010 1047   MCHC 34.3 04/04/2021 1045   RDW 14.3 04/04/2021 1045   RDW 13.9 08/20/2019 1242   LYMPHSABS 1.6 04/04/2021 1045   LYMPHSABS 1.6 08/20/2019 1242   MONOABS 0.4 04/04/2021 1045   EOSABS 0.3 04/04/2021 1045   EOSABS 0.2 08/20/2019 1242   BASOSABS 0.0 04/04/2021 1045   BASOSABS 0.1 08/20/2019 1242    BMET    Component Value Date/Time   NA 140 04/04/2021 1045   NA 140 08/20/2019 1242   K 4.5 04/04/2021 1045   CL 107 04/04/2021 1045   CO2 29 04/04/2021 1045   GLUCOSE 104 (H) 04/04/2021 1045   BUN 18 04/04/2021 1045   BUN 19 08/20/2019 1242   CREATININE 1.15 04/04/2021 1045   CALCIUM 9.5 04/04/2021 1045   GFRNONAA 55 (L) 08/20/2019 1242   GFRAA 64 08/20/2019 1242    BNP No results found for: BNP   Imaging:  MYOCARDIAL PERFUSION IMAGING  Result Date: 06/07/2021   The study is normal. The study is low risk.   No ST deviation was noted.   Left ventricular function is normal. Nuclear stress EF: 59 %. The left ventricular ejection fraction is normal  (55-65%). End diastolic cavity size is normal.   Prior study available for comparison from 08/18/2019.    technetium tetrofosmin (TC-MYOVIEW) injection [id number] millicurie     Date Action Dose Route User   06/06/2021 1125 Contrast Given [id number] millicurie Intravenous Compton, Gina E      technetium tetrofosmin (TC-MYOVIEW) injection [id number] millicurie     Date Action Dose Route User   06/06/2021 1300 Contrast Given [id number] millicurie Intravenous Compton, Gina E          Latest Ref Rng & Units 05/17/2021   10:45 AM  PFT Results  FVC-Pre L 3.78    FVC-Predicted Pre % 122    FVC-Post L 3.80    FVC-Predicted Post % 123    Pre FEV1/FVC % % 68    Post FEV1/FCV % % 71    FEV1-Pre L 2.57    FEV1-Predicted Pre % 120    FEV1-Post L 2.69    DLCO uncorrected ml/min/mmHg 19.86    DLCO UNC% % 97    DLCO corrected ml/min/mmHg 20.33    DLCO COR %Predicted % 99    DLVA Predicted % 88    TLC L 5.97    TLC % Predicted % 98    RV % Predicted % 100      Lab Results  Component Value Date   NITRICOXIDE 39 06/19/2021        Assessment & Plan:   Mild persistent asthma with acute exacerbation Elevated FeNO and evidence of bronchospasm. Could be causing his AM chest tightness/PND vs uncontrolled OSA. Will treat with prednisone taper today. Stepped up Advair. Advise he  continue trigger prevention with Claritin. Could consider addition of sinuglair moving forward.   Patient Instructions  Step up Advair 2 puffs Twice daily. Brush tongue and rinse mouth afterwards Continue Albuterol inhaler 2 puffs every 6 hours as needed for shortness of breath or wheezing. Notify if symptoms persist despite rescue inhaler/neb use. Continue fluticasone 1-2 sprays each nostril daily  Continue protonix 40 mg daily  Continue Claritin 10 mg daily over the counter for allergies   Prednisone taper. 4 tabs for 2 days, then 3 tabs for 2 days, 2 tabs for 2 days, then 1 tab for 2 days, then stop. Take in AM with food.     Continue to use CPAP every night, minimum of 4-6 hours a night.  CPAP titration study ordered to evaluate need for possible BiPAP therapy.    Follow up after CPAP titration with Dr. Halford Chessman or Katie Brysyn Brandenberger,NP. If symptoms do not improve or worsen, please contact office for sooner follow up or seek emergency care.     OSA on CPAP Remains uncontrolled despite increase in pressures with breakthrough AHI 9.4 and daytime fatigue. Could be contributing to his nocturnal and AM symptoms.  Suspect he may need BiPAP therapy. CPAP titration ordered for further evaluation.     I spent 35 minutes of dedicated to the care of this patient on the date of this encounter to include pre-visit review of records, face-to-face time with the patient discussing conditions above, post visit ordering of testing, clinical documentation with the electronic health record, making appropriate referrals as documented, and communicating necessary findings to members of the patients care team.  Clayton Bibles, NP 06/19/2021  Pt aware and understands NP's role.

## 2021-06-19 NOTE — Assessment & Plan Note (Addendum)
Remains uncontrolled despite increase in pressures with breakthrough AHI 9.4 and daytime fatigue. Could be contributing to his nocturnal and AM symptoms.  Suspect he may need BiPAP therapy. CPAP titration ordered for further evaluation.

## 2021-06-19 NOTE — Assessment & Plan Note (Signed)
Elevated FeNO and evidence of bronchospasm. Could be causing his AM chest tightness/PND vs uncontrolled OSA. Will treat with prednisone taper today. Stepped up Advair. Advise he continue trigger prevention with Claritin. Could consider addition of sinuglair moving forward.   Patient Instructions  Step up Advair 2 puffs Twice daily. Brush tongue and rinse mouth afterwards Continue Albuterol inhaler 2 puffs every 6 hours as needed for shortness of breath or wheezing. Notify if symptoms persist despite rescue inhaler/neb use. Continue fluticasone 1-2 sprays each nostril daily  Continue protonix 40 mg daily  Continue Claritin 10 mg daily over the counter for allergies   Prednisone taper. 4 tabs for 2 days, then 3 tabs for 2 days, 2 tabs for 2 days, then 1 tab for 2 days, then stop. Take in AM with food.    Continue to use CPAP every night, minimum of 4-6 hours a night.  CPAP titration study ordered to evaluate need for possible BiPAP therapy.    Follow up after CPAP titration with Dr. Halford Chessman or Katie Cecely Rengel,NP. If symptoms do not improve or worsen, please contact office for sooner follow up or seek emergency care.

## 2021-08-28 ENCOUNTER — Ambulatory Visit: Payer: Medicare HMO | Admitting: Cardiology

## 2021-10-05 DIAGNOSIS — J40 Bronchitis, not specified as acute or chronic: Secondary | ICD-10-CM | POA: Diagnosis not present

## 2021-10-05 DIAGNOSIS — J329 Chronic sinusitis, unspecified: Secondary | ICD-10-CM | POA: Diagnosis not present

## 2021-10-05 DIAGNOSIS — Z6828 Body mass index (BMI) 28.0-28.9, adult: Secondary | ICD-10-CM | POA: Diagnosis not present

## 2021-10-05 DIAGNOSIS — J301 Allergic rhinitis due to pollen: Secondary | ICD-10-CM | POA: Diagnosis not present

## 2021-10-23 DIAGNOSIS — Z0100 Encounter for examination of eyes and vision without abnormal findings: Secondary | ICD-10-CM | POA: Diagnosis not present

## 2021-10-23 DIAGNOSIS — H52223 Regular astigmatism, bilateral: Secondary | ICD-10-CM | POA: Diagnosis not present

## 2021-10-23 DIAGNOSIS — H43813 Vitreous degeneration, bilateral: Secondary | ICD-10-CM | POA: Diagnosis not present

## 2021-11-02 DIAGNOSIS — Z23 Encounter for immunization: Secondary | ICD-10-CM | POA: Diagnosis not present

## 2021-11-21 DIAGNOSIS — M7542 Impingement syndrome of left shoulder: Secondary | ICD-10-CM | POA: Diagnosis not present

## 2021-11-21 DIAGNOSIS — M19012 Primary osteoarthritis, left shoulder: Secondary | ICD-10-CM | POA: Diagnosis not present

## 2021-11-21 DIAGNOSIS — M19011 Primary osteoarthritis, right shoulder: Secondary | ICD-10-CM | POA: Diagnosis not present

## 2021-11-21 DIAGNOSIS — M7541 Impingement syndrome of right shoulder: Secondary | ICD-10-CM | POA: Diagnosis not present

## 2022-01-01 DIAGNOSIS — R1013 Epigastric pain: Secondary | ICD-10-CM | POA: Diagnosis not present

## 2022-01-01 DIAGNOSIS — Z6826 Body mass index (BMI) 26.0-26.9, adult: Secondary | ICD-10-CM | POA: Diagnosis not present

## 2022-01-01 DIAGNOSIS — K2960 Other gastritis without bleeding: Secondary | ICD-10-CM | POA: Diagnosis not present

## 2022-01-08 DIAGNOSIS — E785 Hyperlipidemia, unspecified: Secondary | ICD-10-CM | POA: Diagnosis not present

## 2022-01-08 DIAGNOSIS — R739 Hyperglycemia, unspecified: Secondary | ICD-10-CM | POA: Diagnosis not present

## 2022-01-08 DIAGNOSIS — E538 Deficiency of other specified B group vitamins: Secondary | ICD-10-CM | POA: Diagnosis not present

## 2022-01-15 DIAGNOSIS — D510 Vitamin B12 deficiency anemia due to intrinsic factor deficiency: Secondary | ICD-10-CM | POA: Diagnosis not present

## 2022-01-15 DIAGNOSIS — I25119 Atherosclerotic heart disease of native coronary artery with unspecified angina pectoris: Secondary | ICD-10-CM | POA: Diagnosis not present

## 2022-01-15 DIAGNOSIS — K2960 Other gastritis without bleeding: Secondary | ICD-10-CM | POA: Diagnosis not present

## 2022-01-15 DIAGNOSIS — J410 Simple chronic bronchitis: Secondary | ICD-10-CM | POA: Diagnosis not present

## 2022-01-15 DIAGNOSIS — Z0000 Encounter for general adult medical examination without abnormal findings: Secondary | ICD-10-CM | POA: Diagnosis not present

## 2022-01-15 DIAGNOSIS — N1831 Chronic kidney disease, stage 3a: Secondary | ICD-10-CM | POA: Diagnosis not present

## 2022-01-15 DIAGNOSIS — E785 Hyperlipidemia, unspecified: Secondary | ICD-10-CM | POA: Diagnosis not present

## 2022-01-15 DIAGNOSIS — N411 Chronic prostatitis: Secondary | ICD-10-CM | POA: Diagnosis not present

## 2022-01-15 DIAGNOSIS — E538 Deficiency of other specified B group vitamins: Secondary | ICD-10-CM | POA: Diagnosis not present

## 2022-01-15 DIAGNOSIS — Z23 Encounter for immunization: Secondary | ICD-10-CM | POA: Diagnosis not present

## 2022-01-15 DIAGNOSIS — J301 Allergic rhinitis due to pollen: Secondary | ICD-10-CM | POA: Diagnosis not present

## 2022-01-15 DIAGNOSIS — G720 Drug-induced myopathy: Secondary | ICD-10-CM | POA: Diagnosis not present

## 2022-02-08 DIAGNOSIS — M791 Myalgia, unspecified site: Secondary | ICD-10-CM | POA: Diagnosis not present

## 2022-03-05 ENCOUNTER — Other Ambulatory Visit: Payer: Self-pay | Admitting: Cardiology

## 2022-03-27 DIAGNOSIS — N138 Other obstructive and reflux uropathy: Secondary | ICD-10-CM | POA: Diagnosis not present

## 2022-03-27 DIAGNOSIS — N401 Enlarged prostate with lower urinary tract symptoms: Secondary | ICD-10-CM | POA: Diagnosis not present

## 2022-03-27 DIAGNOSIS — G4733 Obstructive sleep apnea (adult) (pediatric): Secondary | ICD-10-CM | POA: Diagnosis not present

## 2022-04-23 ENCOUNTER — Encounter (HOSPITAL_BASED_OUTPATIENT_CLINIC_OR_DEPARTMENT_OTHER): Payer: Self-pay | Admitting: Pulmonary Disease

## 2022-04-23 ENCOUNTER — Ambulatory Visit (HOSPITAL_BASED_OUTPATIENT_CLINIC_OR_DEPARTMENT_OTHER): Payer: Medicare HMO | Admitting: Pulmonary Disease

## 2022-04-23 VITALS — BP 134/78 | HR 47 | Temp 98.6°F | Ht 64.5 in | Wt 170.6 lb

## 2022-04-23 DIAGNOSIS — G4733 Obstructive sleep apnea (adult) (pediatric): Secondary | ICD-10-CM | POA: Diagnosis not present

## 2022-04-23 DIAGNOSIS — J4520 Mild intermittent asthma, uncomplicated: Secondary | ICD-10-CM

## 2022-04-23 NOTE — Progress Notes (Signed)
[facility]  Chief Complaint  Patient presents with   Follow-up    Breathing is overall doing well. He is using his CPAP every night and doing well other than occ mask leak. Rarely uses albuterol.     Constitutional:  BP 134/78 (BP Location: Left Arm, Cuff Size: Normal)   Pulse (!) 47   Temp 98.6 F (37 C) (Oral)   Ht 5' 4.5" (1.638 m)   Wt 170 lb 9.6 oz (77.4 kg)   SpO2 99% Comment: on RA  BMI 28.83 kg/m   Past Medical History:  Varicose veins, GERD, Neuropathy, HLD, BPH, non obstructive CAD  Past Surgical History:  His  has a past surgical history that includes Appendectomy [id number]); Spine surgery; Nasal sinus surgery; Colonoscopy (02/10/2013); and LEFT HEART CATH AND CORONARY ANGIOGRAPHY (N/A, 08/31/2019).  Brief Summary:  [name] [name] is a 82 y.o. male with obstructive sleep apnea and asthma.      Subjective:   Doesn't need inhaler therapy at this time.  Not having cough, wheeze or sputum.  Sinuses are okay.  Uses CPAP nightly.  Has full face mask.  Mouth gets dry.  Uses ACT dry mouth rinse.   Physical Exam:   Appearance - well kempt   ENMT - no sinus tenderness, no oral exudate, no LAN, Mallampati 3 airway, no stridor  Respiratory - equal breath sounds bilaterally, no wheezing or rales  CV - s1s2 regular rate and rhythm, no murmurs  Ext - no clubbing, no edema  Skin - no rashes  Psych - normal mood and affect     Pulmonary Tests:  PFT 05/17/21 >> FEV1 2.69 (126%), FEV1% 71, TLC 5.97 (98%), DLCO 97%  Chest imaging:  CT chest 04/28/21 >> 5 mm GGO in RT lung   Sleep Tests:  PSG 04/21/03 >> AHI 54 ONO with CPAP 10/09/19 >> test time 7 hrs 48 min.  Baseline SpO2 95%.  Low SpO2 80%.  Spent 3 min 8 sec with SpO2 < 88%. CPAP 03/21/22 to 04/19/22 >> used on 29 of 30 nights with average 7 hrs 14 min.  Average AHI 8 with CPAP 16 cm H2O  Cardiac Tests:  Echo 08/18/19 >> EF 60 to 65%, grade 1 DD  Social History:  He   reports that he quit smoking about 58 years ago. His smoking use included cigarettes. He started smoking about 70 years ago. He has a 12.00 pack-year smoking history. He has been exposed to tobacco smoke. He has never used smokeless tobacco. He reports that he does not drink alcohol and does not use drugs.  Family History:  His family history includes Allergies in his brother, father, mother, and sister; Asthma in his brother; Cancer in his brother and sister; Heart disease in his brother, mother, and sister.     Assessment/Plan:   Obstructive sleep apnea - he is compliant with CPAP and reports benefit from therapy - uses [employer] for his DME - he got his current CPAP in April 2023 - continue CPAP 16 cm H2O - he can try increasing humidifier setting to see if this helps with mouth dryness  Mild, intermittent asthma. - prn albuterol  Time Spent Involved in Patient Care on Day of Examination:  25 minutes  Follow up:   Patient Instructions  Follow up in 1 year  Medication List:   Allergies as of 04/23/2022       Reactions   Penicillins Other (See Comments)   Does  not work for pt (ineffective)        Medication List        Accurate as of April 23, 2022 11:40 AM. If you have any questions, ask your nurse or doctor.          STOP taking these medications    fluticasone-salmeterol 230-21 MCG/ACT inhaler Commonly known as: Advair HFA Stopped by: [name], MD   naproxen sodium 220 MG tablet Commonly known as: ALEVE Stopped by: [name], MD   predniSONE 10 MG tablet Commonly known as: DELTASONE Stopped by: [name], MD   rosuvastatin 10 MG tablet Commonly known as: CRESTOR Stopped by: [name], MD       TAKE these medications    acetaminophen 500 MG tablet Commonly known as: TYLENOL Take 500 mg by mouth at bedtime.   albuterol 108 (90 Base) MCG/ACT inhaler Commonly known as: Ventolin HFA Inhale 2 puffs into the lungs every 6 (six) hours as  needed for wheezing or shortness of breath.   aspirin EC 81 MG tablet Take 1 tablet (81 mg total) by mouth daily. Swallow whole.   B-12 IJ Inject into the muscle.   cholecalciferol 25 MCG (1000 UT) tablet Generic drug: Cholecalciferol Take 1,000 Units by mouth daily.   fluticasone 50 MCG/ACT nasal spray Commonly known as: FLONASE Place 1-2 sprays into both nostrils daily as needed for allergies.   KONSYL FIBER PO Take 1 Dose by mouth daily. 1 teaspoon   metoprolol succinate 25 MG 24 hr tablet Commonly known as: TOPROL-XL Take 0.5 tablets (12.5 mg total) by mouth daily.   nitroGLYCERIN 0.4 MG SL tablet Commonly known as: NITROSTAT Place 1 tablet (0.4 mg total) under the tongue every 5 (five) minutes as needed.   pantoprazole 40 MG tablet Commonly known as: PROTONIX Take 1 tablet (40 mg total) by mouth daily.   tamsulosin 0.4 MG Caps capsule Commonly known as: FLOMAX Take 0.4 mg by mouth at bedtime.        Signature:  [name], MD [place] Pager - [phone] 04/23/2022, 11:40 AM

## 2022-04-23 NOTE — Patient Instructions (Signed)
Follow up in 1 year.

## 2022-04-25 DIAGNOSIS — G4733 Obstructive sleep apnea (adult) (pediatric): Secondary | ICD-10-CM | POA: Diagnosis not present

## 2022-05-04 ENCOUNTER — Telehealth: Payer: Self-pay | Admitting: Cardiology

## 2022-05-04 ENCOUNTER — Ambulatory Visit: Payer: Medicare HMO | Admitting: Cardiology

## 2022-05-04 MED ORDER — METOPROLOL SUCCINATE ER 25 MG PO TB24: 12.5000 mg | ORAL_TABLET | Freq: Every day | ORAL | 0 refills | Status: DC

## 2022-05-04 NOTE — Telephone Encounter (Signed)
Called patient to reschedule appointment due to Dr. Dulce Sellar being out with COVID- patient is rescheduled for 05/21/22. He stated he needs a refill on metoprolol sent to [facility] in [place]. He would like a 90 day rx if possible.

## 2022-05-18 NOTE — Progress Notes (Unsigned)
Cardiology Office Note:    Date:  05/21/2022   ID:  [name] [name], DOB August 02, 1940, MRN [id number]  PCP:  Street, [name], MD  Cardiologist:  Norman Herrlich, MD    Referring MD: [address] [name], *    ASSESSMENT:    1. Mild CAD   2. Essential hypertension   3. Mixed hyperlipidemia    PLAN:    In order of problems listed above:  Continues to do well with no anginal discomfort we will continue medical therapy including aspirin beta-blocker and resume his statin he requested a low-dose of a low intensity and told him that it causes muscle symptoms he can reduce to 2 days/week 2 months we will check a lipid profile Continue his beta-blocker Continue his statin   Next appointment: 1 year   Medication Adjustments/Labs and Tests Ordered: Current medicines are reviewed at length with the patient today.  Concerns regarding medicines are outlined above.  No orders of the defined types were placed in this encounter.  No orders of the defined types were placed in this encounter.   Chief complaint follow-up CAD  History of Present Illness:    [name] [name] is a 82 y.o. male with a hx of mild CAD less than 50% mid LAD stenosis August 2021 COPD hyperlipidemia sleep apnea symptomatic APCs and BPH last seen 05/15/2021.  Following that visit he underwent a myocardial perfusion study reported 06/06/2021 low risk EF normal 59% no ischemia noticed.  An echocardiogram in March 2022 showed normal left ventricular function EF 55 to 60% grade 1 diastolic dysfunction normal right ventricular size function PA pressure could not be estimated and a proBNP level was low at 116 chest x-ray showed a pulmonary nodule but no other significant abnormalities or findings of heart failure.  Compliance with diet, lifestyle and medications: Yes  He was told that his cholesterol was good and stopped taking a statin. 1 day he missed beta-blocker and had palpitation Otherwise no cardiovascular symptoms  of edema shortness of breath chest pain palpitation or syncope He is an active walking as well as heavy gardening work including tree work Past Medical History:  Diagnosis Date   Acute prostatitis 12/19/2015   Acute respiratory infection    Allergic rhinitis, seasonal    Apnea, sleep 09/18/2007   NPSG 2005:  AHI 54/hr New machine 2014 Auto 2014:  Optimal pressure 20cm, but still with some leak and breakthru events.  Download 2015:  Pressure 18, no significant leaks, AHI 12/hr.    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   DOE (dyspnea on exertion) 07/22/2012   Dyslipidemia    Dysuria    Elevated PSA    Elevated PSA, less than 10 ng/ml    Encounter for screening for cardiovascular disorders 07/29/2019   Enlarged prostate    Esophageal reflux    Esophagitis, reflux 07/29/2019   Family history of prostate cancer 10/22/2018   Flu vaccine need 07/29/2019   GERD (gastroesophageal reflux disease)    Hemorrhoids    Herniated nucleus pulposus, L3-4 left    HLD (hyperlipidemia) 07/29/2019   Hyperlipidemia  Hypertrophy of nasal turbinates 07/29/2019   Hypochromic microcytic anemia    Internal and external prolapsed hemorrhoids    LBP (low back pain) 07/29/2019   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  Download 2015:  Pressure 18, no significant leaks, AHI 12/hr.     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   albuterol (VENTOLIN HFA) 108 (90 Base) MCG/ACT inhaler Inhale 2 puffs into the lungs every 6 (six) hours as needed for wheezing or shortness of breath.   aspirin EC 81 MG tablet Take 1 tablet (81 mg total) by mouth daily. Swallow whole.   Calcium Polycarbophil (KONSYL FIBER PO) Take 1 Dose by mouth daily. 1 teaspoon   Cyanocobalamin (B-12 IJ) Inject into the muscle.   fluticasone (FLONASE) 50 MCG/ACT nasal spray Place 1-2 sprays into both nostrils daily as needed for allergies.    metoprolol succinate (TOPROL-XL) 25 MG 24 hr tablet Take 0.5 tablets (12.5 mg total) by mouth daily.   nitroGLYCERIN (NITROSTAT) 0.4 MG SL tablet Place 1 tablet (0.4 mg total) under the tongue every 5 (five) minutes as needed.   pantoprazole (PROTONIX) 40 MG tablet Take 1 tablet (40 mg total) by mouth daily.   tamsulosin (FLOMAX) 0.4 MG CAPS capsule Take 0.4 mg by mouth at bedtime.      Allergies:   Penicillins   Social History   Socioeconomic History   Marital status: Married    Spouse name: Not on file   Number of children: Not on file    Years of education: Not on file   Highest education level: Not on file  Occupational History   Occupation: retired  Tobacco Use   Smoking status: Former    Packs/day: 1.00    Years: 12.00    Additional pack years: 0.00    Total pack years: 12.00    Types: Cigarettes    Start date: 2    Quit date: 01/30/1964    Years since quitting: 58.3    Passive exposure: Past   Smokeless tobacco: Never  Substance and Sexual Activity   Alcohol use: No   Drug use: No   Sexual activity: Not on file  Other Topics Concern   Not on file  Social History Narrative  Not on file   Social Determinants of Health   Financial Resource Strain: Not on file  Food Insecurity: Not on file  Transportation Needs: Not on file  Physical Activity: Not on file  Stress: Not on file  Social Connections: Not on file     Family History: The patient's family history includes Allergies in his brother, father, mother, and sister; Asthma in his brother; Cancer in his brother and sister; Heart disease in his brother, mother, and sister. ROS:   Please see the history of present illness.    All other systems reviewed and are negative.  EKGs/Labs/Other Studies Reviewed:    The following studies were reviewed today:  Cardiac Studies & Procedures   CARDIAC CATHETERIZATION  CARDIAC CATHETERIZATION 08/31/2019  Narrative  Dist RCA lesion is 20% stenosed.  Ost Cx to Prox Cx lesion is 20% stenosed.  Prox LAD to Mid LAD lesion is 50% stenosed.  1. Moderate non-obstructive proximal to mid LAD stenosis 2. Mild plaque in the RCA and Circumflex  Recommendations: Medical management of CAD. Explore other causes of chest pain.  Findings Coronary Findings Diagnostic  Dominance: Right  Left Anterior Descending Vessel is large. Prox LAD to Mid LAD lesion is 50% stenosed.  Left Circumflex Vessel is large. Ost Cx to Prox Cx lesion is 20% stenosed.  Right Coronary Artery Vessel is large. Dist RCA lesion is 20%  stenosed.  Intervention  No interventions have been documented.   STRESS TESTS  MYOCARDIAL PERFUSION IMAGING 06/07/2021  Narrative   The study is normal. The study is low risk.   No ST deviation was noted.   Left ventricular function is normal. Nuclear stress EF: 59 %. The left ventricular ejection fraction is normal (55-65%). End diastolic cavity size is normal.   Prior study available for comparison from 08/18/2019.   ECHOCARDIOGRAM  ECHOCARDIOGRAM COMPLETE 04/19/2021  Narrative ECHOCARDIOGRAM REPORT    Patient Name:   JOHNTAY DOOLEN Date of Exam: 04/19/2021 Medical Rec #:  [id number]      Height:       64.0 in Accession #:    [id number]     Weight:       178.0 lb Date of Birth:  05/17/1940      BSA:          1.862 m Patient Age:    82 years       BP:           122/72 mmHg Patient Gender: M              HR:           52 bpm. Exam Location:  [facility]  Procedure: 2D Echo, Cardiac Doppler, Color Doppler, 3D Echo and Strain Analysis  Indications:    SOB (shortness of breath) on exertion [R06.02 (ICD-10-CM)]  History:        Patient has prior history of Echocardiogram examinations, most recent 08/18/2019. CAD, Arrythmias:PAC, Signs/Symptoms:Dyspnea; Risk Factors:Hypertension and Dyslipidemia.  Sonographer:    Margreta Journey RDCS Referring Phys: [id number] Nayah Lukens J Marvene Strohm  IMPRESSIONS   1. Left ventricular ejection fraction, by estimation, is 55 to 60%. The left ventricle has normal function. The left ventricle has no regional wall motion abnormalities. Left ventricular diastolic parameters are consistent with Grade I diastolic dysfunction (impaired relaxation). The average left ventricular global longitudinal strain is -19.5 %. 2. Right ventricular systolic function is normal. The right ventricular size is normal. Tricuspid regurgitation signal is inadequate for assessing PA pressure. 3. The mitral  valve is normal in structure. No evidence of mitral valve regurgitation. No  evidence of mitral stenosis. 4. The aortic valve is tricuspid. Aortic valve regurgitation is not visualized. No aortic stenosis is present. 5. The inferior vena cava is normal in size with greater than 50% respiratory variability, suggesting right atrial pressure of 3 mmHg.  FINDINGS Left Ventricle: Left ventricular ejection fraction, by estimation, is 55 to 60%. The left ventricle has normal function. The left ventricle has no regional wall motion abnormalities. The average left ventricular global longitudinal strain is -19.5 %. The left ventricular internal cavity size was normal in size. There is no left ventricular hypertrophy. Left ventricular diastolic parameters are consistent with Grade I diastolic dysfunction (impaired relaxation). Normal left ventricular filling pressure.  Right Ventricle: The right ventricular size is normal. No increase in right ventricular wall thickness. Right ventricular systolic function is normal. Tricuspid regurgitation signal is inadequate for assessing PA pressure. The tricuspid regurgitant velocity is 2.26 m/s, and with an assumed right atrial pressure of 3 mmHg, the estimated right ventricular systolic pressure is 23.4 mmHg.  Left Atrium: Left atrial size was normal in size.  Right Atrium: Right atrial size was normal in size.  Pericardium: There is no evidence of pericardial effusion.  Mitral Valve: The mitral valve is normal in structure. No evidence of mitral valve regurgitation. No evidence of mitral valve stenosis.  Tricuspid Valve: The tricuspid valve is normal in structure. Tricuspid valve regurgitation is trivial. No evidence of tricuspid stenosis.  Aortic Valve: The aortic valve is tricuspid. Aortic valve regurgitation is not visualized. No aortic stenosis is present.  Pulmonic Valve: The pulmonic valve was normal in structure. Pulmonic valve regurgitation is not visualized. No evidence of pulmonic stenosis.  Aorta: The aortic root and  ascending aorta are structurally normal, with no evidence of dilitation and the aortic arch was not well visualized.  Venous: The pulmonary veins were not well visualized. The inferior vena cava is normal in size with greater than 50% respiratory variability, suggesting right atrial pressure of 3 mmHg.  IAS/Shunts: No atrial level shunt detected by color flow Doppler.   LEFT VENTRICLE PLAX 2D LVIDd:         5.10 cm   Diastology LVIDs:         3.80 cm   LV e' medial:    8.38 cm/s LV PW:         0.90 cm   LV E/e' medial:  7.0 LV IVS:        0.80 cm   LV e' lateral:   10.10 cm/s LVOT diam:     2.00 cm   LV E/e' lateral: 5.8 LV SV:         63 LV SV Index:   34        2D Longitudinal Strain LVOT Area:     3.14 cm  2D Strain GLS Avg:     -19.5 %  3D Volume EF: 3D EF:        60 % LV EDV:       139 ml LV ESV:       55 ml LV SV:        84 ml  RIGHT VENTRICLE RV Basal diam:  2.80 cm RV S prime:     10.70 cm/s TAPSE (M-mode): 3.1 cm  LEFT ATRIUM             Index        RIGHT ATRIUM  Index LA diam:        3.40 cm 1.83 cm/m   RA Area:     16.00 cm LA Vol (A2C):   43.8 ml 23.53 ml/m  RA Volume:   39.60 ml  21.27 ml/m LA Vol (A4C):   30.8 ml 16.54 ml/m LA Biplane Vol: 38.2 ml 20.52 ml/m AORTIC VALVE LVOT Vmax:   87.90 cm/s LVOT Vmean:  50.000 cm/s LVOT VTI:    0.202 m  AORTA Ao Root diam: 3.80 cm Ao Asc diam:  3.50 cm  MITRAL VALVE               TRICUSPID VALVE MV Area (PHT): 2.39 cm    TR Peak grad:   20.4 mmHg MV Decel Time: 317 msec    TR Vmax:        226.00 cm/s MV E velocity: 58.30 cm/s MV A velocity: 61.30 cm/s  SHUNTS MV E/A ratio:  0.95        Systemic VTI:  0.20 m Systemic Diam: 2.00 cm  Norman Herrlich MD Electronically signed by Norman Herrlich MD Signature Date/Time: 04/19/2021/1:01:12 PM    Final    MONITORS  LONG TERM MONITOR (3-14 DAYS) 08/25/2019  Narrative The patient wore the monitor for 6 days 15 hours starting July 30, 2019. Indication:  Palpitations  The minimum heart rate was 44 bpm, maximum heart rate was 152 bpm, and average heart rate was 65 bpm. Predominant underlying rhythm was Sinus Rhythm.  Supraventricular Tachycardia runs occurred, the run with the fastest interval lasting 4 beats with a maximum rate of 152 bpm, the longest lasting 11beats with an average rate of 98 bpm.  Junctional Rhythm was present.  Premature atrial complexes were rare. Premature Ventricular complexes were rare.  No ventricular tachycardia, no pauses, No AV block and no atrial fibrillation present.  14 patient triggered events 3 associated with supraventricular tachycardia, 6 associated with premature atrial complexes, 2 associated with junctional rhythm and the remaining was sinus rhythm. 15 diary events 6 associated with junctional rhythm, 2 associated with premature atrial complexes.  Conclusion: This study is remarkable for the following. 1.  Symptomatic paroxysmal supraventricular tachycardia. 2.  Symptomatic rare premature atrial complex.           EKG:  EKG ordered today and personally reviewed.  The ekg ordered today demonstrates sinus rhythm 57 bpm normal    Physical Exam:    VS:  BP 138/78 (BP Location: Right Arm, Patient Position: Sitting, Cuff Size: Normal)   Pulse (!) 57   Ht 5' 4.5" (1.638 m)   Wt 172 lb 12.8 oz (78.4 kg)   SpO2 99%   BMI 29.20 kg/m     Wt Readings from Last 3 Encounters:  05/21/22 172 lb 12.8 oz (78.4 kg)  04/23/22 170 lb 9.6 oz (77.4 kg)  06/19/21 175 lb (79.4 kg)     GEN:  Well nourished, well developed in no acute distress HEENT: Normal NECK: No JVD; No carotid bruits LYMPHATICS: No lymphadenopathy CARDIAC: RRR, no murmurs, rubs, gallops RESPIRATORY:  Clear to auscultation without rales, wheezing or rhonchi  ABDOMEN: Soft, non-tender, non-distended MUSCULOSKELETAL:  No edema; No deformity  SKIN: Warm and dry NEUROLOGIC:  Alert and oriented x 3 PSYCHIATRIC:  Normal affect     Signed, Norman Herrlich, MD  05/21/2022 8:31 AM    [facility]

## 2022-05-21 ENCOUNTER — Encounter: Payer: Self-pay | Admitting: Cardiology

## 2022-05-21 ENCOUNTER — Ambulatory Visit: Payer: Medicare HMO | Attending: Cardiology | Admitting: Cardiology

## 2022-05-21 VITALS — BP 138/78 | HR 57 | Ht 64.5 in | Wt 172.8 lb

## 2022-05-21 DIAGNOSIS — E782 Mixed hyperlipidemia: Secondary | ICD-10-CM | POA: Diagnosis not present

## 2022-05-21 DIAGNOSIS — I10 Essential (primary) hypertension: Secondary | ICD-10-CM

## 2022-05-21 DIAGNOSIS — I2510 Atherosclerotic heart disease of native coronary artery without angina pectoris: Secondary | ICD-10-CM

## 2022-05-21 MED ORDER — PRAVASTATIN SODIUM 20 MG PO TABS: 20.0000 mg | ORAL_TABLET | Freq: Every evening | ORAL | 3 refills | Status: AC

## 2022-05-21 NOTE — Addendum Note (Signed)
Addended by: Roxanne Mins I on: 05/21/2022 09:02 AM   Modules accepted: Orders

## 2022-05-21 NOTE — Patient Instructions (Signed)
Medication Instructions:  Your physician has recommended you make the following change in your medication:   START: Pravastatin 20 mg daily with evening meal  *If you need a refill on your cardiac medications before your next appointment, please call your pharmacy*   Lab Work: Your physician recommends that you return for lab work in:   Labs in 2 months: CMP, Lipids  If you have labs (blood work) drawn today and your tests are completely normal, you will receive your results only by: MyChart Message (if you have MyChart) OR A paper copy in the mail If you have any lab test that is abnormal or we need to change your treatment, we will call you to review the results.   Testing/Procedures: None   Follow-Up: At [facility], you and your health needs are our priority.  As part of our continuing mission to provide you with exceptional heart care, we have created designated Provider Care Teams.  These Care Teams include your primary Cardiologist (physician) and Advanced Practice Providers (APPs -  Physician Assistants and Nurse Practitioners) who all work together to provide you with the care you need, when you need it.  We recommend signing up for the patient portal called "MyChart".  Sign up information is provided on this After Visit Summary.  MyChart is used to connect with patients for Virtual Visits (Telemedicine).  Patients are able to view lab/test results, encounter notes, upcoming appointments, etc.  Non-urgent messages can be sent to your provider as well.   To learn more about what you can do with MyChart, go to [email or web address].    Your next appointment:   1 year(s)  Provider:   Norman Herrlich, MD    Other Instructions None

## 2022-05-26 DIAGNOSIS — G4733 Obstructive sleep apnea (adult) (pediatric): Secondary | ICD-10-CM | POA: Diagnosis not present

## 2022-08-06 ENCOUNTER — Other Ambulatory Visit: Payer: Self-pay | Admitting: Cardiology

## 2022-10-12 DIAGNOSIS — K219 Gastro-esophageal reflux disease without esophagitis: Secondary | ICD-10-CM | POA: Diagnosis not present

## 2022-10-12 DIAGNOSIS — M1990 Unspecified osteoarthritis, unspecified site: Secondary | ICD-10-CM | POA: Diagnosis not present

## 2022-10-12 DIAGNOSIS — E785 Hyperlipidemia, unspecified: Secondary | ICD-10-CM | POA: Diagnosis not present

## 2022-10-12 DIAGNOSIS — J439 Emphysema, unspecified: Secondary | ICD-10-CM | POA: Diagnosis not present

## 2022-10-12 DIAGNOSIS — N400 Enlarged prostate without lower urinary tract symptoms: Secondary | ICD-10-CM | POA: Diagnosis not present

## 2022-10-12 DIAGNOSIS — N189 Chronic kidney disease, unspecified: Secondary | ICD-10-CM | POA: Diagnosis not present

## 2022-10-12 DIAGNOSIS — I471 Supraventricular tachycardia, unspecified: Secondary | ICD-10-CM | POA: Diagnosis not present

## 2022-10-12 DIAGNOSIS — Z008 Encounter for other general examination: Secondary | ICD-10-CM | POA: Diagnosis not present

## 2022-10-12 DIAGNOSIS — I25119 Atherosclerotic heart disease of native coronary artery with unspecified angina pectoris: Secondary | ICD-10-CM | POA: Diagnosis not present

## 2022-10-12 DIAGNOSIS — G629 Polyneuropathy, unspecified: Secondary | ICD-10-CM | POA: Diagnosis not present

## 2022-10-12 DIAGNOSIS — I129 Hypertensive chronic kidney disease with stage 1 through stage 4 chronic kidney disease, or unspecified chronic kidney disease: Secondary | ICD-10-CM | POA: Diagnosis not present

## 2022-10-12 DIAGNOSIS — Z8249 Family history of ischemic heart disease and other diseases of the circulatory system: Secondary | ICD-10-CM | POA: Diagnosis not present

## 2022-10-12 DIAGNOSIS — J301 Allergic rhinitis due to pollen: Secondary | ICD-10-CM | POA: Diagnosis not present

## 2022-10-19 DIAGNOSIS — H903 Sensorineural hearing loss, bilateral: Secondary | ICD-10-CM | POA: Diagnosis not present

## 2022-10-19 DIAGNOSIS — Z974 Presence of external hearing-aid: Secondary | ICD-10-CM | POA: Diagnosis not present

## 2022-11-01 DIAGNOSIS — Z23 Encounter for immunization: Secondary | ICD-10-CM | POA: Diagnosis not present

## 2023-01-07 DIAGNOSIS — J40 Bronchitis, not specified as acute or chronic: Secondary | ICD-10-CM | POA: Diagnosis not present

## 2023-01-07 DIAGNOSIS — Z6827 Body mass index (BMI) 27.0-27.9, adult: Secondary | ICD-10-CM | POA: Diagnosis not present

## 2023-01-07 DIAGNOSIS — J329 Chronic sinusitis, unspecified: Secondary | ICD-10-CM | POA: Diagnosis not present

## 2023-01-18 DIAGNOSIS — G4733 Obstructive sleep apnea (adult) (pediatric): Secondary | ICD-10-CM | POA: Diagnosis not present

## 2023-01-18 DIAGNOSIS — N1831 Chronic kidney disease, stage 3a: Secondary | ICD-10-CM | POA: Diagnosis not present

## 2023-01-18 DIAGNOSIS — Z0000 Encounter for general adult medical examination without abnormal findings: Secondary | ICD-10-CM | POA: Diagnosis not present

## 2023-01-18 DIAGNOSIS — G63 Polyneuropathy in diseases classified elsewhere: Secondary | ICD-10-CM | POA: Diagnosis not present

## 2023-01-18 DIAGNOSIS — E785 Hyperlipidemia, unspecified: Secondary | ICD-10-CM | POA: Diagnosis not present

## 2023-01-18 DIAGNOSIS — K2960 Other gastritis without bleeding: Secondary | ICD-10-CM | POA: Diagnosis not present

## 2023-01-18 DIAGNOSIS — D510 Vitamin B12 deficiency anemia due to intrinsic factor deficiency: Secondary | ICD-10-CM | POA: Diagnosis not present

## 2023-01-18 DIAGNOSIS — J410 Simple chronic bronchitis: Secondary | ICD-10-CM | POA: Diagnosis not present

## 2023-01-18 DIAGNOSIS — E538 Deficiency of other specified B group vitamins: Secondary | ICD-10-CM | POA: Diagnosis not present

## 2023-01-18 DIAGNOSIS — G720 Drug-induced myopathy: Secondary | ICD-10-CM | POA: Diagnosis not present

## 2023-01-18 DIAGNOSIS — M533 Sacrococcygeal disorders, not elsewhere classified: Secondary | ICD-10-CM | POA: Diagnosis not present

## 2023-01-18 DIAGNOSIS — I25119 Atherosclerotic heart disease of native coronary artery with unspecified angina pectoris: Secondary | ICD-10-CM | POA: Diagnosis not present

## 2023-01-27 ENCOUNTER — Other Ambulatory Visit: Payer: Self-pay | Admitting: Cardiology

## 2023-04-17 DIAGNOSIS — N401 Enlarged prostate with lower urinary tract symptoms: Secondary | ICD-10-CM | POA: Diagnosis not present

## 2023-04-17 DIAGNOSIS — N402 Nodular prostate without lower urinary tract symptoms: Secondary | ICD-10-CM | POA: Diagnosis not present

## 2023-04-17 DIAGNOSIS — R310 Gross hematuria: Secondary | ICD-10-CM | POA: Diagnosis not present

## 2023-04-22 ENCOUNTER — Other Ambulatory Visit: Payer: Self-pay | Admitting: Cardiology

## 2023-04-24 DIAGNOSIS — G4733 Obstructive sleep apnea (adult) (pediatric): Secondary | ICD-10-CM | POA: Diagnosis not present

## 2023-04-24 DIAGNOSIS — J4520 Mild intermittent asthma, uncomplicated: Secondary | ICD-10-CM | POA: Diagnosis not present

## 2023-05-10 DIAGNOSIS — M1611 Unilateral primary osteoarthritis, right hip: Secondary | ICD-10-CM | POA: Diagnosis not present

## 2023-05-13 DIAGNOSIS — M1611 Unilateral primary osteoarthritis, right hip: Secondary | ICD-10-CM | POA: Diagnosis not present

## 2023-06-05 DIAGNOSIS — M1611 Unilateral primary osteoarthritis, right hip: Secondary | ICD-10-CM | POA: Diagnosis not present

## 2023-06-10 DIAGNOSIS — N401 Enlarged prostate with lower urinary tract symptoms: Secondary | ICD-10-CM | POA: Diagnosis not present

## 2023-06-10 DIAGNOSIS — R3129 Other microscopic hematuria: Secondary | ICD-10-CM | POA: Diagnosis not present

## 2023-06-10 DIAGNOSIS — N138 Other obstructive and reflux uropathy: Secondary | ICD-10-CM | POA: Diagnosis not present

## 2023-06-11 ENCOUNTER — Other Ambulatory Visit: Payer: Self-pay

## 2023-06-11 DIAGNOSIS — M5416 Radiculopathy, lumbar region: Secondary | ICD-10-CM | POA: Insufficient documentation

## 2023-06-12 ENCOUNTER — Ambulatory Visit: Admitting: Cardiology

## 2023-06-12 ENCOUNTER — Ambulatory Visit

## 2023-06-12 VITALS — BP 128/70 | HR 61 | Ht 64.6 in | Wt 165.6 lb

## 2023-06-12 DIAGNOSIS — I2510 Atherosclerotic heart disease of native coronary artery without angina pectoris: Secondary | ICD-10-CM | POA: Diagnosis not present

## 2023-06-12 DIAGNOSIS — I10 Essential (primary) hypertension: Secondary | ICD-10-CM | POA: Diagnosis not present

## 2023-06-12 DIAGNOSIS — I471 Supraventricular tachycardia, unspecified: Secondary | ICD-10-CM

## 2023-06-12 DIAGNOSIS — E782 Mixed hyperlipidemia: Secondary | ICD-10-CM | POA: Diagnosis not present

## 2023-06-12 MED ORDER — EZETIMIBE 10 MG PO TABS: 10.0000 mg | ORAL_TABLET | Freq: Every day | ORAL | 3 refills | Status: AC

## 2023-06-12 MED ORDER — METOPROLOL SUCCINATE ER 25 MG PO TB24: 12.5000 mg | ORAL_TABLET | Freq: Every day | ORAL | 3 refills | Status: AC

## 2023-06-12 NOTE — Assessment & Plan Note (Signed)
 Nonobstructive disease on cath August 2021. Lexiscan  stress test May 2023 no ischemia.  Good functional status. Somewhat limited now due to right hip pain.  Currently not on aspirin . Advised him to be on aspirin  81 mg once daily if tolerated and once clearance from urologist after evaluation for his microscopic hematuria.  Lipid-lowering therapy importance for risk reduction discussed.  See below under hyperlipidemia

## 2023-06-12 NOTE — Progress Notes (Signed)
 Cardiology Consultation:    Date:  06/12/2023   ID:  TIQUAN BENDEL, DOB 18-Aug-1940, MRN [id number]  PCP:  Street, Renford Cartwright, MD  Cardiologist:  Daymon Evans Elene Downum, MD   Referring MD: Street, Renford Cartwright, *   No chief complaint on file.    ASSESSMENT AND PLAN:   Mr. Nessmith 83 year old male with history of  moderate nonobstructive LAD disease on cardiac cath August 2021 and subsequent Lexi scan stress test with nuclear imaging May 2023 without ischemia, normal biventricular function on echocardiogram March 2023 with grade 1 diastolic dysfunction, hypertension, hyperlipidemia, short runs of paroxysmal SVT [nonsustained runs up to 11 beats on prior heart monitor July 2021; rare PACs] obstructive sleep apnea, right hip pain limiting his mobility Problem List Items Addressed This Visit     Coronary artery disease involving native heart - Primary   Nonobstructive disease on cath August 2021. Lexiscan  stress test May 2023 no ischemia.  Good functional status. Somewhat limited now due to right hip pain.  Currently not on aspirin . Advised him to be on aspirin  81 mg once daily if tolerated and once clearance from urologist after evaluation for his microscopic hematuria.  Lipid-lowering therapy importance for risk reduction discussed.  See below under hyperlipidemia       Relevant Medications   ezetimibe (ZETIA) 10 MG tablet   metoprolol  succinate (TOPROL -XL) 25 MG 24 hr tablet   Essential hypertension   Well-controlled. Not on any specific medications.  Low-dose metoprolol  for paroxysmal SVT as above.       Relevant Medications   ezetimibe (ZETIA) 10 MG tablet   metoprolol  succinate (TOPROL -XL) 25 MG 24 hr tablet   Paroxysmal SVT (supraventricular tachycardia) (HCC)   Nonsustained runs on prior heart monitor July 2021 with the longest episode 11 beats. Rare PACs. Symptoms well-controlled on low-dose metoprolol  succinate 12.5 mg once daily. Recently he cut down the dose  to every other day as he was running short and medications.  New prescription being sent today.       Relevant Medications   ezetimibe (ZETIA) 10 MG tablet   metoprolol  succinate (TOPROL -XL) 25 MG 24 hr tablet   Hyperlipidemia   Last lipid panel from December 2020 for review.  LDL number not available.  HDL not at goal. Statins role in prevention of plaque progression and preventing heart attack and stroke discussed.  However he is not willing to consider taking statins.  Alternate lipid-lowering medications discussed. Role for Zetia in the setting reviewed. Various side effects associated with the medication discussed. He is willing to try if cost covered through the insurance. Will start Zetia [ezetimibe] 10 mg once daily.       Relevant Medications   ezetimibe (ZETIA) 10 MG tablet   metoprolol  succinate (TOPROL -XL) 25 MG 24 hr tablet   Other Relevant Orders   EKG 12-Lead (Completed)   Return to clinic tentatively in 12 months or as needed.   History of Present Illness:    SPIKE EKIS is a 83 y.o. male who is being seen today for follow-up visit. PCP is Street, [place], *. Last visit with us  in the office was 05/21/2022 with Dr. Sandee Crook.  History of moderate nonobstructive LAD disease on cardiac cath August 2021 and subsequent Lexi scan stress test with nuclear imaging May 2023 without ischemia, normal biventricular function on echocardiogram March 2023 with grade 1 diastolic dysfunction, hypertension, hyperlipidemia, obstructive sleep apnea, recent with right hip pain limiting his mobility.  History of short runs  of paroxysmal supraventricular tachycardia lasting up to 11 beats [prior heart monitor July 2021].  Pleasant gentleman here for the visit by himself.  Gives himself busy at home with day-to-day activities.  Has a routine for body stretching and strengthening exercises every day.  Until recently was walking regularly but for the last couple months with right  hip pain worse with sitting down his movements have been limited.  He is pending further evaluation with orthopedics.  Denies any cardiac symptoms. Denies any chest pain, shortness of breath, orthopnea, paroxysmal nocturnal dyspnea.  At times does feel palpitations described as a sensation of fluttering in the chest lasting for few seconds. This has been recently more frequent as he cut back on metoprolol  succinate dose to 12.5 mg every other day as he was running short on prescription.  Does not take aspirin  as he prefers to avoid medications.  Denies any side effects to it in the past. Recently with microscopic hematuria reported by his urologist is pending further evaluation. Has never started taking pravastatin  that was prescribed previously by Dr. Sandee Crook because he prefers not to take any statins.  EKG in the clinic today shows sinus rhythm heart rate 61/min, PR interval 144 ms, QRS axis normal with duration 96 ms, no significant ST-T changes to suggest ischemia.  QTc normal 386 ms.  Blood work from 06/10/2023 with sodium 141, potassium 4, BUN 20, creatinine 1.17, EGFR 61 Lipid panel from 01/07/2023 total cholesterol 184, HDL 28, triglycerides 110.  LDL not available at this time to review. Hemoglobin A1c 5.3.  Past Medical History:  Diagnosis Date   Abnormal stress test    Acute prostatitis 12/19/2015   Acute respiratory infection    Allergic rhinitis, seasonal    Apnea, sleep 09/18/2007   NPSG 2005:  AHI 54/hr New machine 2014 Auto 2014:  Optimal pressure 20cm, but still with some leak and breakthru events.  Download 2015:  Pressure 18, no significant leaks, AHI 12/hr.    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   DOE (dyspnea on exertion) 07/22/2012   Dyslipidemia    Dysuria    Elevated blood-pressure reading, without diagnosis of hypertension 06/19/2019   Elevated PSA    Elevated PSA, less than 10 ng/ml    Encounter for screening for cardiovascular disorders 07/29/2019   Enlarged prostate    Esophageal reflux    Esophagitis, reflux 07/29/2019   Essential hypertension 09/17/2019   Family history of prostate cancer 10/22/2018   Flu vaccine need 07/29/2019   GERD (gastroesophageal reflux disease)    Hemorrhoids    Herniated nucleus pulposus, L3-4 left    HLD (hyperlipidemia) 07/29/2019   Hyperlipidemia    Hypertrophy of nasal turbinates 07/29/2019   Hypochromic microcytic anemia    Internal and external prolapsed hemorrhoids    LBP (low back pain) 07/29/2019   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  Download 2015:  Pressure 18, no significant leaks, AHI 12/hr.     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   albuterol  (VENTOLIN  HFA) 108 (90 Base) MCG/ACT inhaler Inhale 2 puffs into the lungs every 6 (six) hours as needed for wheezing or shortness of breath.   aspirin  EC 81 MG tablet Take 1 tablet (81 mg total) by mouth daily. Swallow whole.   Calcium  Polycarbophil (KONSYL FIBER PO) Take 1 Dose by mouth daily. 1 teaspoon   cyanocobalamin  (VITAMIN B12) 1000 MCG/ML injection Inject 1,000 mcg into the muscle every 14 (fourteen) days.   ezetimibe (ZETIA) 10 MG tablet Take 1 tablet (10 mg total) by mouth daily.   fluticasone  (FLONASE ) 50 MCG/ACT nasal spray Place 1-2 sprays into both nostrils daily as needed for allergies.    nitroGLYCERIN  (NITROSTAT ) 0.4 MG SL tablet Place 0.4 mg under the tongue every 5 (five) minutes as needed for chest pain.   pantoprazole  (PROTONIX ) 40 MG tablet Take 1 tablet (40 mg total) by mouth daily.   pravastatin  (PRAVACHOL ) 20 MG tablet Take 1 tablet (20 mg total) by mouth every evening.   tamsulosin (FLOMAX) 0.4 MG CAPS capsule Take 0.4 mg by mouth at bedtime.    [DISCONTINUED] metoprolol  succinate (TOPROL -XL) 25 MG 24 hr tablet Take 0.5 tablets (12.5 mg total) by mouth daily.     Allergies:   Penicillins   Social History   Socioeconomic History   Marital status: Married    Spouse name: Not on file   Number of children: Not on file   Years of education: Not on file   Highest education  level: Not on file  Occupational History   Occupation: retired  Tobacco Use   Smoking status: Former    Current packs/day: 0.00    Average packs/day: 1 pack/day for 12.0 years (12.0 ttl pk-yrs)    Types: Cigarettes    Start date: 60    Quit date: 01/30/1964    Years since quitting: 59.4    Passive exposure: Past   Smokeless tobacco: Never  Substance and Sexual Activity   Alcohol  use: No   Drug use: No   Sexual activity: Not on file  Other Topics Concern   Not on file  Social History Narrative   Not on file   Social Drivers of Health   Financial Resource Strain: Not on file  Food Insecurity: Low Risk  (04/24/2023)   Received from [facility]   Hunger Vital Sign    Worried About Running Out of Food in the Last Year: Never true    Ran Out of Food in the Last Year: Never true  Transportation Needs: No Transportation Needs (04/24/2023)   Received from [facility]    In the past 12 months, has lack of reliable transportation kept you from medical appointments, meetings, work or from getting things needed for daily living? : No  Physical Activity: Not on file  Stress: Not on file  Social Connections: Not on file     Family History: The patient's family history includes Allergies in his brother, father, mother, and sister; Asthma in his brother; Cancer in his brother and sister; Heart disease in his brother, mother, and sister. ROS:   Please see the history of present illness.    All 14 point review of systems negative except as described per history of present illness.  EKGs/Labs/Other Studies Reviewed:    The following studies were reviewed today:   EKG:  EKG Interpretation Date/Time:  Wednesday Jun 12 2023 13:12:26 EDT Ventricular Rate:  61 PR Interval:  144 QRS Duration:  96 QT Interval:  384 QTC Calculation: 386 R Axis:   56  Text Interpretation: Normal sinus rhythm Normal ECG When compared with ECG of 31-Aug-2019 06:19, No significant change  was found Confirmed by Bertha Broad reddy [phone]) on 06/12/2023 1:17:48 PM    Recent Labs: No results found for requested labs within last 365 days.  Recent Lipid Panel No results found for: "CHOL", "TRIG", "HDL", "CHOLHDL", "VLDL", "LDLCALC", "LDLDIRECT"  Physical Exam:    VS:  BP 128/70   Pulse 61   Ht 5' 4.6" (1.641 m)   Wt 165 lb 9.6 oz (75.1 kg)   SpO2 95%   BMI 27.90 kg/m     Wt Readings from Last 3 Encounters:  06/12/23 165 lb 9.6 oz (75.1 kg)  05/21/22 172 lb 12.8 oz (78.4 kg)  04/23/22 170 lb 9.6 oz (77.4 kg)     GENERAL:  Well nourished, well developed in no acute distress NECK: No JVD; No carotid bruits CARDIAC: RRR, S1 and S2 present, no murmurs, no rubs, no gallops CHEST:  Clear to auscultation without rales, wheezing or rhonchi  Extremities: No pitting pedal edema. Pulses bilaterally symmetric with radial 2+ and dorsalis pedis 2+ NEUROLOGIC:  Alert and oriented x 3  Medication Adjustments/Labs and Tests Ordered: Current medicines are reviewed at length with the patient today.  Concerns regarding medicines are outlined above.  Orders Placed This Encounter  Procedures   EKG 12-Lead   Meds ordered this encounter  Medications   ezetimibe (ZETIA) 10 MG tablet    Sig: Take 1 tablet (10 mg total) by mouth daily.    Dispense:  90 tablet    Refill:  3   metoprolol  succinate (TOPROL -XL) 25 MG 24 hr tablet    Sig: Take 0.5 tablets (12.5 mg total) by mouth daily.    Dispense:  45 tablet    Refill:  3    Signed, Makenli Derstine reddy Willard Madrigal, MD, MPH, [facility]. 06/12/2023 1:38 PM    [facility]

## 2023-06-12 NOTE — Patient Instructions (Addendum)
 Medication Instructions:   Start ezetimbie 10 mg once a day  *If you need a refill on your cardiac medications before your next appointment, please call your pharmacy*   Lab Work: None Ordered If you have labs (blood work) drawn today and your tests are completely normal, you will receive your results only by: MyChart Message (if you have MyChart) OR A paper copy in the mail If you have any lab test that is abnormal or we need to change your treatment, we will call you to review the results.   Testing/Procedures: None Ordered   Follow-Up: At [facility], you and your health needs are our priority.  As part of our continuing mission to provide you with exceptional heart care, we have created designated Provider Care Teams.  These Care Teams include your primary Cardiologist (physician) and Advanced Practice Providers (APPs -  Physician Assistants and Nurse Practitioners) who all work together to provide you with the care you need, when you need it.  We recommend signing up for the patient portal called "MyChart".  Sign up information is provided on this After Visit Summary.  MyChart is used to connect with patients for Virtual Visits (Telemedicine).  Patients are able to view lab/test results, encounter notes, upcoming appointments, etc.  Non-urgent messages can be sent to your provider as well.   To learn more about what you can do with MyChart, go to [email or web address].    Your next appointment:   1 year follow up

## 2023-06-12 NOTE — Assessment & Plan Note (Signed)
 Well-controlled. Not on any specific medications.  Low-dose metoprolol  for paroxysmal SVT as above.

## 2023-06-12 NOTE — Assessment & Plan Note (Signed)
 Nonsustained runs on prior heart monitor July 2021 with the longest episode 11 beats. Rare PACs. Symptoms well-controlled on low-dose metoprolol  succinate 12.5 mg once daily. Recently he cut down the dose to every other day as he was running short and medications.  New prescription being sent today.

## 2023-06-12 NOTE — Assessment & Plan Note (Signed)
 Last lipid panel from December 2020 for review.  LDL number not available.  HDL not at goal. Statins role in prevention of plaque progression and preventing heart attack and stroke discussed.  However he is not willing to consider taking statins.  Alternate lipid-lowering medications discussed. Role for Zetia in the setting reviewed. Various side effects associated with the medication discussed. He is willing to try if cost covered through the insurance. Will start Zetia [ezetimibe] 10 mg once daily.

## 2023-06-17 DIAGNOSIS — R3129 Other microscopic hematuria: Secondary | ICD-10-CM | POA: Diagnosis not present

## 2023-06-17 DIAGNOSIS — N400 Enlarged prostate without lower urinary tract symptoms: Secondary | ICD-10-CM | POA: Diagnosis not present

## 2023-06-17 DIAGNOSIS — K429 Umbilical hernia without obstruction or gangrene: Secondary | ICD-10-CM | POA: Diagnosis not present

## 2023-06-17 DIAGNOSIS — N401 Enlarged prostate with lower urinary tract symptoms: Secondary | ICD-10-CM | POA: Diagnosis not present

## 2023-06-17 DIAGNOSIS — N281 Cyst of kidney, acquired: Secondary | ICD-10-CM | POA: Diagnosis not present

## 2023-06-30 IMAGING — CT CT CHEST W/O CM
2 of 3 series · 15 of 36 positions shown, 18 images · non-contrast
Comparison: Chest radiographs 04/14/2021 and 04/04/2021. Nuclear
medicine perfusion lung scan 04/14/2021.

CLINICAL DATA: Chronic dyspnea and cough since COVID 19 infection 5
months ago.



[Series 2: thorax · axial · 0.74mm/px · z∈[-336,-48]mm · 12 of 170 slices shown, 15 images]
[im 13/170  mediastinal]
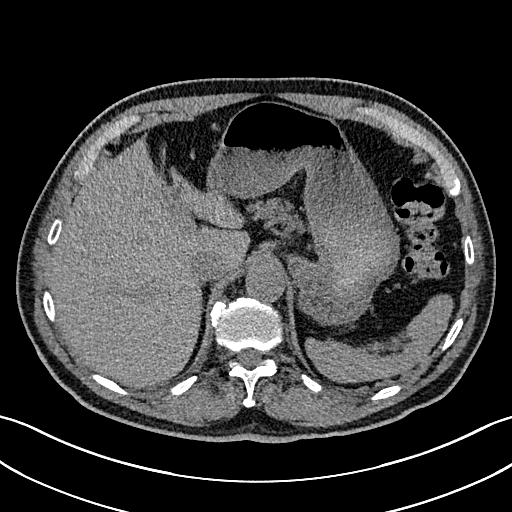
[im 13/170  lung]
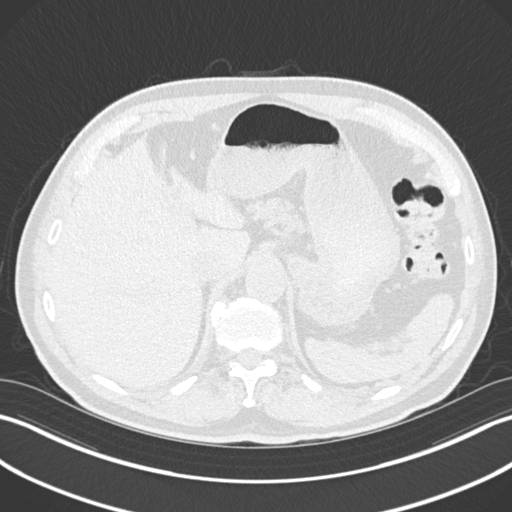
[im 26/170  lung]
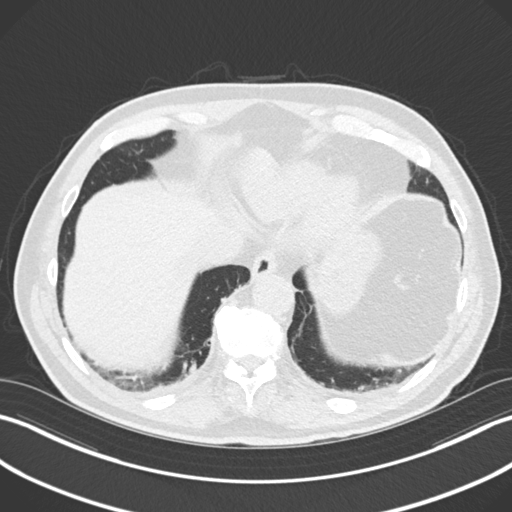
[im 38/170  lung]
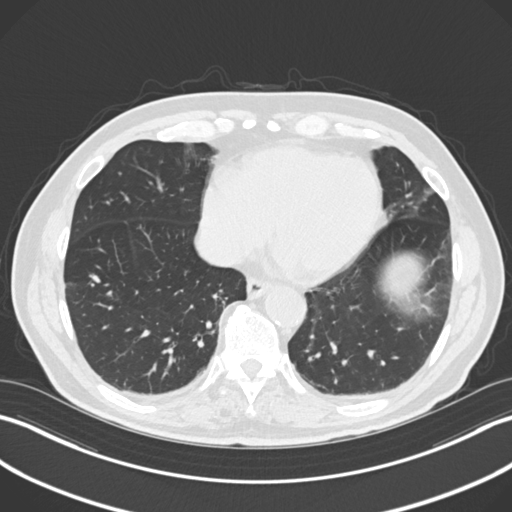
[im 51/170  lung]
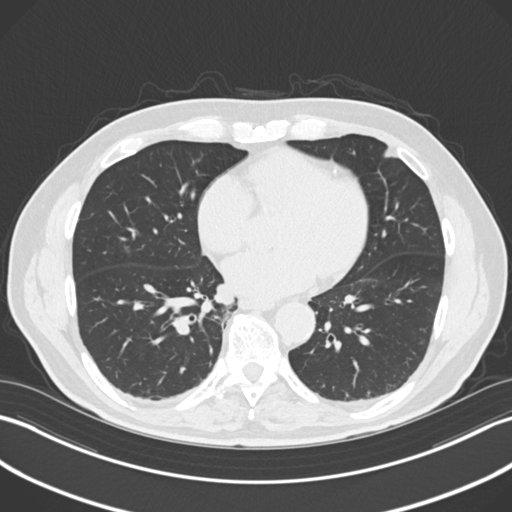
[im 63/170  mediastinal]
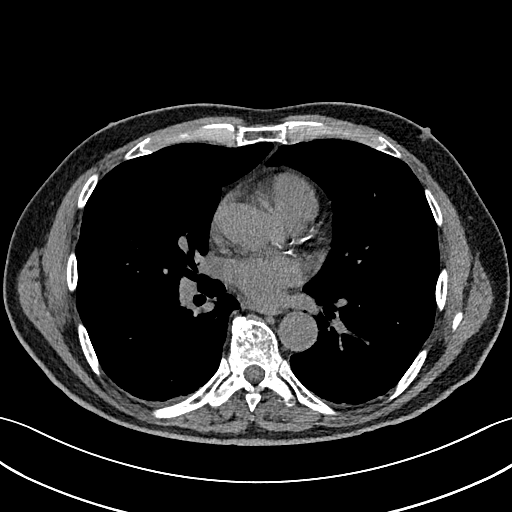
[im 63/170  lung]
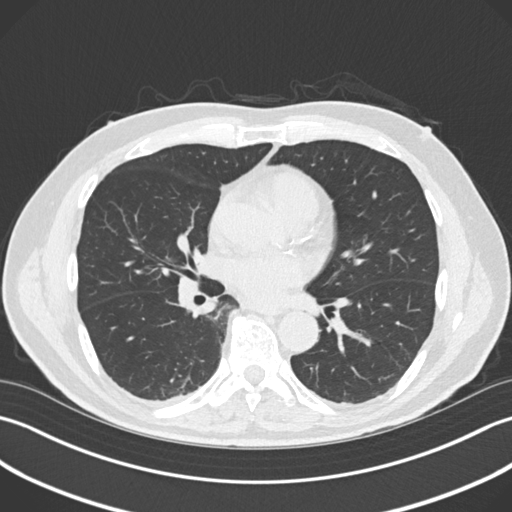
[im 76/170  lung]
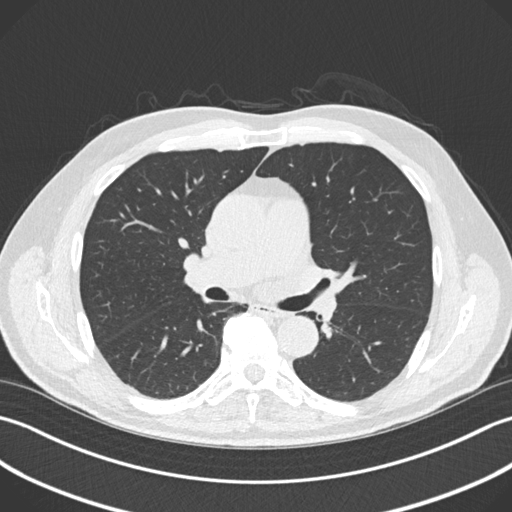
[im 94/170  lung]
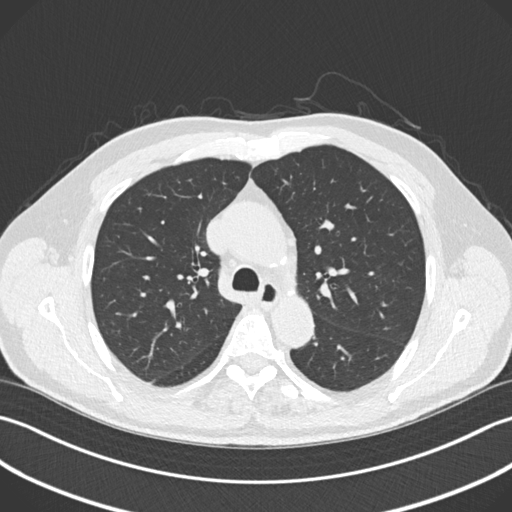
[im 107/170  lung]
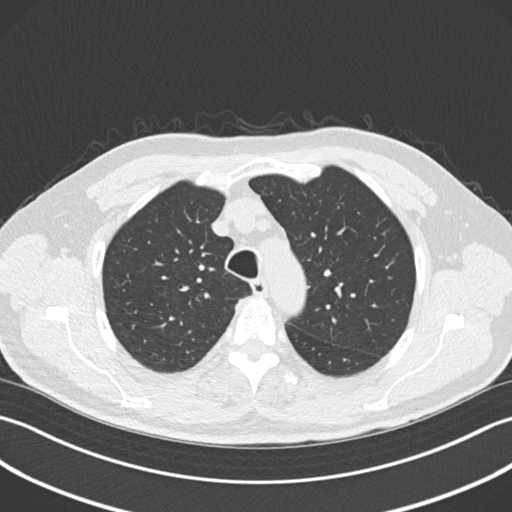
[im 119/170  mediastinal]
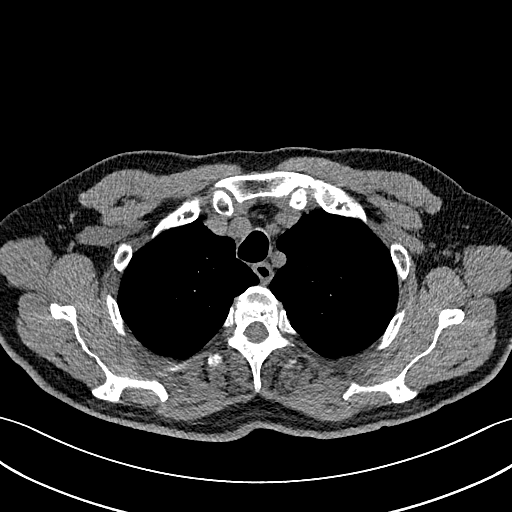
[im 119/170  lung]
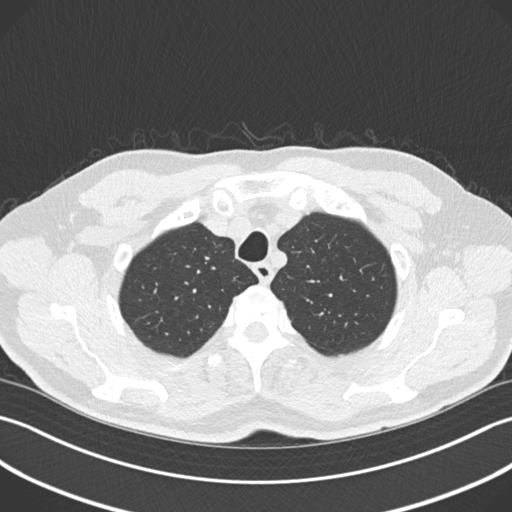
[im 132/170  lung]
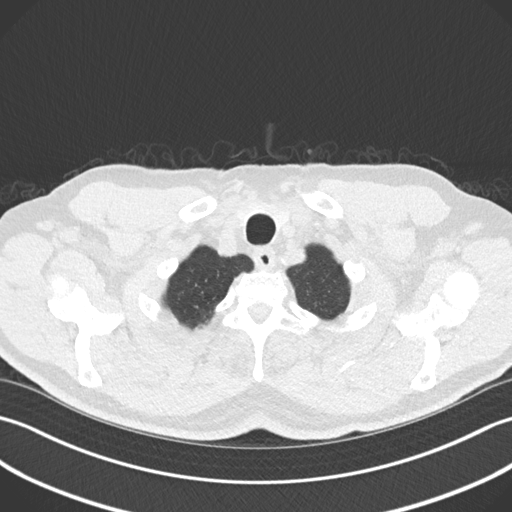
[im 144/170  lung]
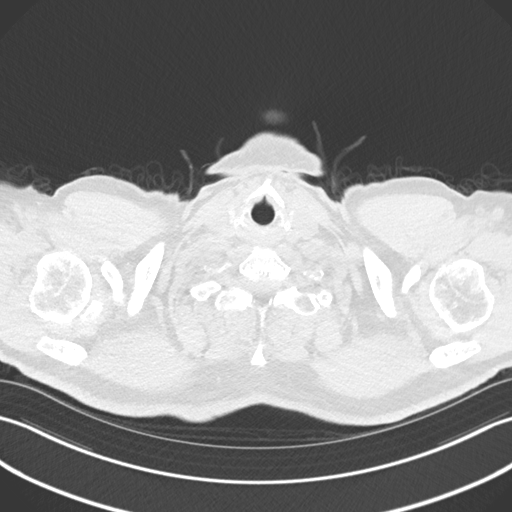
[im 157/170  lung]
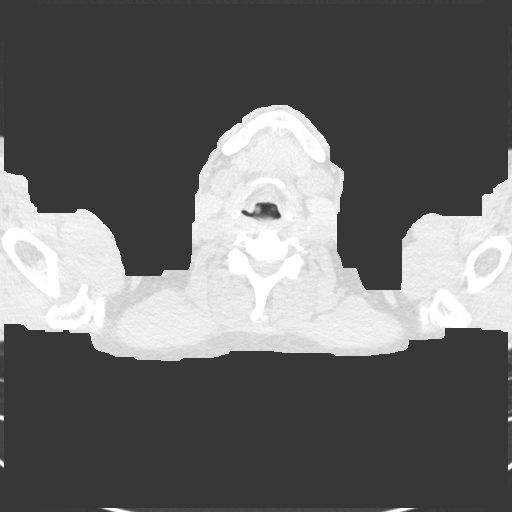

[Series 5: coronal · coronal · 0.69mm/px · 3 of 125 slices shown]
[im 25/125  lung]
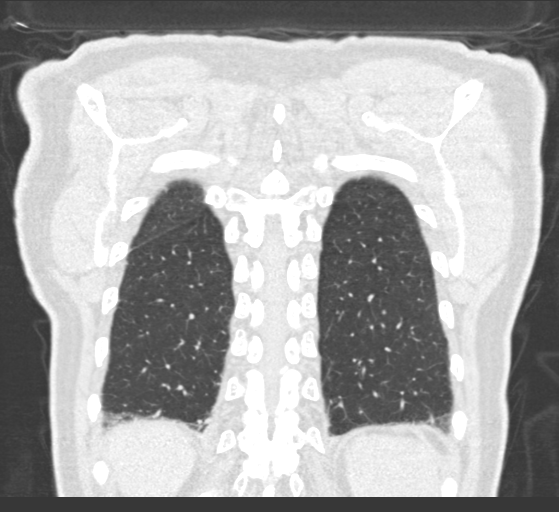
[im 50/125  lung]
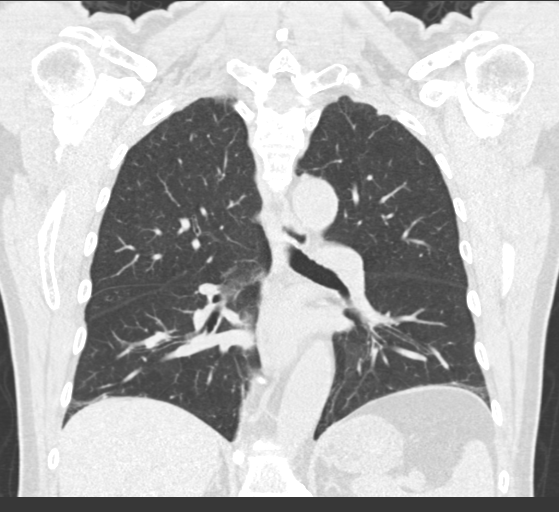
[im 75/125  lung]
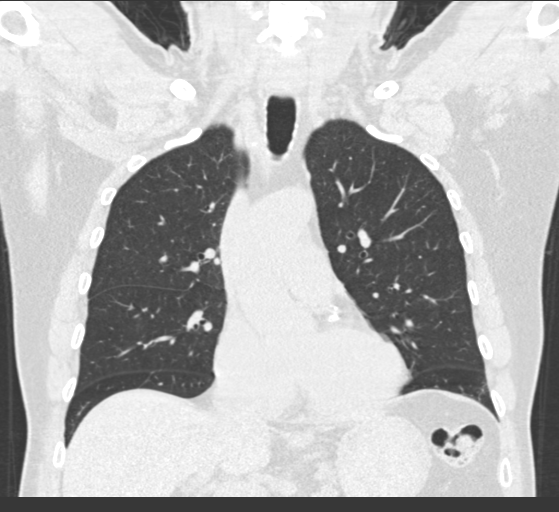

[15 of 36 positions shown; findings below may reference images not displayed]

FINDINGS: Cardiovascular: Atherosclerosis of the aorta, great vessels and
coronary arteries. No acute vascular findings on noncontrast
imaging. The heart size is normal. There is no pericardial effusion.

Mediastinum/Nodes: There are no enlarged mediastinal, hilar or
axillary lymph nodes.Hilar assessment is limited by the lack of
intravenous contrast. The thyroid gland, trachea and esophagus
demonstrate no significant findings.

Lungs/Pleura: No pleural effusion or pneumothorax. Mild linear
scarring at both lung bases. There is a 5 mm ground-glass nodule in
the right middle lobe on image 119/4. No larger or more suspicious
nodules identified. There is no confluent airspace opacity or
evidence of interstitial lung disease.

Upper abdomen: No significant findings are seen within the
visualized upper abdomen.

Musculoskeletal/Chest wall: There is no chest wall mass or
suspicious osseous finding. Probable bone island in the T5 vertebral
body. Scattered mild endplate degenerative changes.
IMPRESSION: 1. No acute chest findings or explanation for the patient's
symptoms.
2. 5 mm right ground-glass pulmonary nodule. No routine follow-up
imaging is recommended per [HOSPITAL] Guidelines.
These guidelines do not apply to immunocompromised patients and
patients with cancer. Follow up in patients with significant
comorbidities as clinically warranted. For lung cancer screening,
adhere to Lung-RADS guidelines. Reference: Radiology. 0982;
3. Coronary and Aortic Atherosclerosis ([id number]-WLG.G).

## 2023-08-06 DIAGNOSIS — M7071 Other bursitis of hip, right hip: Secondary | ICD-10-CM | POA: Diagnosis not present

## 2023-08-28 DIAGNOSIS — M7071 Other bursitis of hip, right hip: Secondary | ICD-10-CM | POA: Diagnosis not present

## 2023-09-12 DIAGNOSIS — M5459 Other low back pain: Secondary | ICD-10-CM | POA: Diagnosis not present

## 2023-09-12 DIAGNOSIS — M7071 Other bursitis of hip, right hip: Secondary | ICD-10-CM | POA: Diagnosis not present

## 2023-09-29 DIAGNOSIS — M5459 Other low back pain: Secondary | ICD-10-CM | POA: Diagnosis not present

## 2023-10-07 DIAGNOSIS — M7071 Other bursitis of hip, right hip: Secondary | ICD-10-CM | POA: Diagnosis not present

## 2023-10-07 DIAGNOSIS — M5416 Radiculopathy, lumbar region: Secondary | ICD-10-CM | POA: Diagnosis not present

## 2023-10-07 DIAGNOSIS — M5459 Other low back pain: Secondary | ICD-10-CM | POA: Diagnosis not present

## 2023-10-22 DIAGNOSIS — Z6827 Body mass index (BMI) 27.0-27.9, adult: Secondary | ICD-10-CM | POA: Diagnosis not present

## 2023-10-22 DIAGNOSIS — N4889 Other specified disorders of penis: Secondary | ICD-10-CM | POA: Diagnosis not present

## 2023-11-19 DIAGNOSIS — N401 Enlarged prostate with lower urinary tract symptoms: Secondary | ICD-10-CM | POA: Diagnosis not present

## 2023-12-06 DIAGNOSIS — G4733 Obstructive sleep apnea (adult) (pediatric): Secondary | ICD-10-CM | POA: Diagnosis not present

## 2023-12-24 DIAGNOSIS — M5416 Radiculopathy, lumbar region: Secondary | ICD-10-CM | POA: Diagnosis not present

## 2024-01-01 DIAGNOSIS — J40 Bronchitis, not specified as acute or chronic: Secondary | ICD-10-CM | POA: Diagnosis not present

## 2024-01-01 DIAGNOSIS — J329 Chronic sinusitis, unspecified: Secondary | ICD-10-CM | POA: Diagnosis not present

## 2024-01-01 DIAGNOSIS — Z6827 Body mass index (BMI) 27.0-27.9, adult: Secondary | ICD-10-CM | POA: Diagnosis not present

## 2024-01-20 DIAGNOSIS — J410 Simple chronic bronchitis: Secondary | ICD-10-CM | POA: Diagnosis not present

## 2024-01-20 DIAGNOSIS — Z9181 History of falling: Secondary | ICD-10-CM | POA: Diagnosis not present

## 2024-01-20 DIAGNOSIS — G44039 Episodic paroxysmal hemicrania, not intractable: Secondary | ICD-10-CM | POA: Diagnosis not present

## 2024-01-20 DIAGNOSIS — N1831 Chronic kidney disease, stage 3a: Secondary | ICD-10-CM | POA: Diagnosis not present

## 2024-01-20 DIAGNOSIS — Z1331 Encounter for screening for depression: Secondary | ICD-10-CM | POA: Diagnosis not present

## 2024-01-20 DIAGNOSIS — E785 Hyperlipidemia, unspecified: Secondary | ICD-10-CM | POA: Diagnosis not present

## 2024-01-20 DIAGNOSIS — Z1339 Encounter for screening examination for other mental health and behavioral disorders: Secondary | ICD-10-CM | POA: Diagnosis not present

## 2024-01-20 DIAGNOSIS — Z0000 Encounter for general adult medical examination without abnormal findings: Secondary | ICD-10-CM | POA: Diagnosis not present

## 2024-01-20 DIAGNOSIS — G63 Polyneuropathy in diseases classified elsewhere: Secondary | ICD-10-CM | POA: Diagnosis not present

## 2024-01-20 DIAGNOSIS — G720 Drug-induced myopathy: Secondary | ICD-10-CM | POA: Diagnosis not present

## 2024-01-20 DIAGNOSIS — I25119 Atherosclerotic heart disease of native coronary artery with unspecified angina pectoris: Secondary | ICD-10-CM | POA: Diagnosis not present

## 2024-01-20 DIAGNOSIS — G4733 Obstructive sleep apnea (adult) (pediatric): Secondary | ICD-10-CM | POA: Diagnosis not present
